# Patient Record
Sex: Female | Born: 1975
Health system: Southern US, Community
[De-identification: ages and names within clinical notes are randomized; demographics above are authoritative.]

## PROBLEM LIST (undated history)

## (undated) DIAGNOSIS — E079 Disorder of thyroid, unspecified: Secondary | ICD-10-CM

## (undated) DIAGNOSIS — A048 Other specified bacterial intestinal infections: Secondary | ICD-10-CM

## (undated) HISTORY — PX: TUBAL LIGATION: SHX77

## (undated) HISTORY — PX: APPENDECTOMY: SHX54

## (undated) HISTORY — DX: Other specified bacterial intestinal infections: A04.8

## (undated) HISTORY — DX: Disorder of thyroid, unspecified: E07.9

---

## 1998-10-11 ENCOUNTER — Inpatient Hospital Stay (HOSPITAL_COMMUNITY): Admission: AD | Admit: 1998-10-11 | Discharge: 1998-10-11 | Payer: Self-pay | Admitting: Obstetrics and Gynecology

## 1999-04-16 ENCOUNTER — Inpatient Hospital Stay (HOSPITAL_COMMUNITY): Admission: AD | Admit: 1999-04-16 | Discharge: 1999-04-16 | Payer: Self-pay | Admitting: Obstetrics and Gynecology

## 1999-05-14 ENCOUNTER — Inpatient Hospital Stay (HOSPITAL_COMMUNITY): Admission: AD | Admit: 1999-05-14 | Discharge: 1999-05-17 | Payer: Self-pay | Admitting: Obstetrics and Gynecology

## 2000-01-08 ENCOUNTER — Other Ambulatory Visit: Admission: RE | Admit: 2000-01-08 | Discharge: 2000-01-08 | Payer: Self-pay | Admitting: Obstetrics and Gynecology

## 2000-08-17 ENCOUNTER — Inpatient Hospital Stay (HOSPITAL_COMMUNITY): Admission: AD | Admit: 2000-08-17 | Discharge: 2000-08-17 | Payer: Self-pay | Admitting: *Deleted

## 2001-03-09 ENCOUNTER — Encounter: Payer: Self-pay | Admitting: Obstetrics and Gynecology

## 2001-03-09 ENCOUNTER — Ambulatory Visit (HOSPITAL_COMMUNITY): Admission: RE | Admit: 2001-03-09 | Discharge: 2001-03-09 | Payer: Self-pay | Admitting: Obstetrics and Gynecology

## 2001-04-09 ENCOUNTER — Other Ambulatory Visit: Admission: RE | Admit: 2001-04-09 | Discharge: 2001-04-09 | Payer: Self-pay | Admitting: Obstetrics and Gynecology

## 2001-05-24 ENCOUNTER — Inpatient Hospital Stay (HOSPITAL_COMMUNITY): Admission: AD | Admit: 2001-05-24 | Discharge: 2001-05-24 | Payer: Self-pay | Admitting: Obstetrics and Gynecology

## 2001-08-04 ENCOUNTER — Encounter: Payer: Self-pay | Admitting: Gastroenterology

## 2001-08-04 ENCOUNTER — Ambulatory Visit (HOSPITAL_COMMUNITY): Admission: RE | Admit: 2001-08-04 | Discharge: 2001-08-04 | Payer: Self-pay | Admitting: Gastroenterology

## 2001-09-10 ENCOUNTER — Encounter: Payer: Self-pay | Admitting: Obstetrics and Gynecology

## 2001-09-10 ENCOUNTER — Ambulatory Visit (HOSPITAL_COMMUNITY): Admission: RE | Admit: 2001-09-10 | Discharge: 2001-09-10 | Payer: Self-pay | Admitting: Obstetrics and Gynecology

## 2001-10-07 ENCOUNTER — Encounter: Payer: Self-pay | Admitting: *Deleted

## 2001-10-07 ENCOUNTER — Ambulatory Visit (HOSPITAL_COMMUNITY): Admission: RE | Admit: 2001-10-07 | Discharge: 2001-10-07 | Payer: Self-pay | Admitting: *Deleted

## 2001-10-12 ENCOUNTER — Encounter: Payer: Self-pay | Admitting: Obstetrics and Gynecology

## 2001-10-12 ENCOUNTER — Encounter (INDEPENDENT_AMBULATORY_CARE_PROVIDER_SITE_OTHER): Payer: Self-pay | Admitting: Specialist

## 2001-10-12 ENCOUNTER — Inpatient Hospital Stay (HOSPITAL_COMMUNITY): Admission: AD | Admit: 2001-10-12 | Discharge: 2001-10-15 | Payer: Self-pay | Admitting: Obstetrics and Gynecology

## 2001-11-21 ENCOUNTER — Other Ambulatory Visit: Admission: RE | Admit: 2001-11-21 | Discharge: 2001-11-21 | Payer: Self-pay | Admitting: Obstetrics and Gynecology

## 2006-06-02 ENCOUNTER — Ambulatory Visit (HOSPITAL_COMMUNITY): Admission: RE | Admit: 2006-06-02 | Discharge: 2006-06-02 | Payer: Self-pay | Admitting: Obstetrics and Gynecology

## 2006-06-08 ENCOUNTER — Encounter (INDEPENDENT_AMBULATORY_CARE_PROVIDER_SITE_OTHER): Payer: Self-pay | Admitting: *Deleted

## 2006-06-08 ENCOUNTER — Ambulatory Visit (HOSPITAL_COMMUNITY): Admission: RE | Admit: 2006-06-08 | Discharge: 2006-06-08 | Payer: Self-pay | Admitting: Obstetrics and Gynecology

## 2009-12-15 ENCOUNTER — Ambulatory Visit (HOSPITAL_COMMUNITY): Admission: RE | Admit: 2009-12-15 | Discharge: 2009-12-15 | Payer: Self-pay | Admitting: Obstetrics & Gynecology

## 2010-02-08 ENCOUNTER — Ambulatory Visit: Payer: Self-pay | Admitting: Gastroenterology

## 2010-02-08 DIAGNOSIS — R1013 Epigastric pain: Secondary | ICD-10-CM | POA: Insufficient documentation

## 2010-02-08 DIAGNOSIS — R112 Nausea with vomiting, unspecified: Secondary | ICD-10-CM | POA: Insufficient documentation

## 2010-02-08 DIAGNOSIS — R1084 Generalized abdominal pain: Secondary | ICD-10-CM | POA: Insufficient documentation

## 2010-02-08 DIAGNOSIS — R197 Diarrhea, unspecified: Secondary | ICD-10-CM | POA: Insufficient documentation

## 2010-02-09 ENCOUNTER — Encounter: Payer: Self-pay | Admitting: Gastroenterology

## 2010-02-10 ENCOUNTER — Encounter: Payer: Self-pay | Admitting: Gastroenterology

## 2010-02-15 ENCOUNTER — Ambulatory Visit: Payer: Self-pay | Admitting: Gastroenterology

## 2010-02-15 ENCOUNTER — Ambulatory Visit (HOSPITAL_COMMUNITY): Admission: RE | Admit: 2010-02-15 | Discharge: 2010-02-15 | Payer: Self-pay | Admitting: Gastroenterology

## 2010-05-05 ENCOUNTER — Encounter (INDEPENDENT_AMBULATORY_CARE_PROVIDER_SITE_OTHER): Payer: Self-pay | Admitting: *Deleted

## 2010-08-10 NOTE — Letter (Signed)
Summary: Recall Office Visit  [facility]  [address]   [place], [place] [address]   Phone: [phone]  Fax: [phone]      May 05, 2010   [facility] [address] [place], [place]  [address] 03-22-1976   Dear Ms. HAMBY,   According to our records, it is time for you to schedule a follow-up office visit with [place].   At your convenience, please call [phone] to schedule an office visit. If you have any questions, concerns, or feel that this letter is in error, we would appreciate your call.   Sincerely,    Diana Eves  [facility] Ph: [phone]   Fax: [phone]

## 2010-08-10 NOTE — Letter (Signed)
Summary: Internal Other /EGD order  Internal Other /EGD order   Imported By: Cloria Spring LPN 04/54/0981 19:14:78  _____________________________________________________________________  External Attachment:    Type:   Image     Comment:   External Document

## 2010-08-10 NOTE — Assessment & Plan Note (Signed)
Summary: NAUSEA, VOMITING, DIARRHEA, ABD PAIN   Visit Type:  Initial Consult Referring Provider:  Cyril Mourning Primary Care Provider:  Cyril Mourning, NP-c  Chief Complaint:  abd pain/acid reflux/n/v.  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 2008: Saw Dr. Carla Drape, wgt: 173 lbs, EGD-SML HIATAL HERNIA, NO CELIAC SPRUE. 2009: NL ABD U/S. APR 2010: 171 LBS. 2009: 171 LBS, ?IBS-D, ON PROZAC 40 MG once daily, RX: COLESTID, DICYCLOMINE, ESOPHAGEAL MANOMETRY-? hypomotile EMD.  Last Fall started having NVD 12-36 hours. Episodes:q3-4 weeks. Bad one in May/June. Called [place]. ABD u/S normal. ?PUD-Hpylori neg. Contstant heartburn. If stomach empties it hurts, spicy, if eats it hurts. No triggers. 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 Ibuprofen, Motrin, ALeve-2-3 times a week, Excedrin Migraine.  EtOH: once a week. No cigs. 8-10 cups of coffee/day.  Aches all the time and then when more intense sharp and stabbing, mid-epigastric and s/t LUQ. No blood in stool or black tarry stools. Milk: 2x/week, ice cream: 0, cheese: 2-3x/wk. No travel, well water, or Abx. 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   Current Medications (verified): 1)  Zantac 300 Mg Tabs (Ranitidine Hcl) .... Take 1 Tablet By Mouth Once A Day 2)  Synthroid 150 Mcg Tabs (Levothyroxine Sodium) .... Take 1 Tablet By Mouth Once A Day 3)  Multivitamins  Caps (Multiple Vitamin) .... Take 1 Tablet By Mouth Once A Day 4)  Omeprazole 40 Mg Cpdr (Omeprazole) .... Take 1 Tablet By Mouth Once A Day  Allergies (verified): 1)  ! * Clindamycin 2)  ! * Latex  Past History:  Past Medical History: "GERD" **MAY 2010-48 HR BRAVO-[facility], oN Zegerid/Zantac-day 1: 10 episodes of reflux, Demeester: 0.9, SAP-95.% heartburn; day 2: 3 episodes of reflux, demeester: 0.3, SAP: 95.9% HEARTBURN Hypothyroidism 2008: BROTHER WITH ADENOMATOUS POLYP?, TCS: prep good, Internal hemorrhoids  Past Surgical  History: Appendectomy Tubal Ligation  Family History: FH of Colon Cancer: mat aunt Brother: polyps: 30s No IBS, liver disease, PUD, or diarrheal illnesses  Social History: Occupation: Charity fundraiser 3 kids: 15, 10, 8  Review of Systems       Per HPI otherwise all systems negative.   Vital Signs:  Patient profile:   35 year old female Height:      65 inches Weight:      202 pounds BMI:     33.74 Temp:     98.1 degrees F oral Pulse rate:   88 / minute BP sitting:   102 / 80  (left arm) Cuff size:   large  Vitals Entered By: Cloria Spring LPN (February 08, 2010 10:06 AM)  Physical Exam  General:  Well developed, well nourished, no acute distress. Head:  Normocephalic and atraumatic. Eyes:  PERRLA, no icterus. Mouth:  No deformity or lesions. Neck:  Supple; no masses. Lungs:  Clear throughout to auscultation. Heart:  Regular rate and rhythm; no murmurs. Abdomen:  Soft, nontender and nondistended. No masses, or hernias noted. Normal bowel sounds. Extremities:  No edema or deformities noted. Neurologic:  Alert and  oriented x4;  grossly normal neurologically.  Impression & Recommendations:  Problem # 1:  NAUSEA WITH VOMITING (ICD-787.01) SYMPTOMS MAY BE DUE TO: **Uncontrolled GERD, NSAID or H. pylori gastritis, Celiac sprue, IBS. Take Dexilant every morning or OMP 30 minues before meals. Avoid Excedrin, Ibuprofen, and Motrin.  Wean down to 200 mg of coffee daily over the next 2-3 weeks.  Upper endoscopy next week. REPEAT BIOPSIES FOR CELIAC SPRUE.  Submit stool for CDIFF toxin, Giardia, and fecal white cells. Return in 3 months.  Orders: T-Fecal WBC ([id number]) T-Stool Giardia / Crypto- EIA ([id number]) T-Culture, C-Diff Toxin A/B ([id number]) T-Culture, C-Diff Toxin A/B ([id number]) T-Culture, C-Diff Toxin A/B ([id number])  Problem # 2:  EPIGASTRIC PAIN (ICD-789.06) FOLLOW A LOW FAT DIET. SYMPTOMS MAY BE DUE TO: **Uncontrolled GERD, NSAID or H. pylori gastritis, Celiac sprue,  IBS, non-ulcer dyspepsia. Take Dexilant every morning or OMP 30 minues before meals. Avoid Excedrin, Ibuprofen, and Motrin.  Wean down to 200 mg of coffee daily over the next 2-3 weeks. Lose 10-20 lbs over the NEXT 6 MOS. MAY NEED AN SSRI.  Problem # 3:  DIARRHEA (ICD-787.91) SYMPTOMS MAY BE DUE TO: Celiac sprue, IBS-D.  Upper endoscopy next week. REPEAT BIOPSIES FOR CELIAC SPRUE. Submit stool for CDIFF toxin, Giardia, and fecal white cells.  Orders: T-Fecal WBC ([id number]) T-Stool Giardia / Crypto- EIA ([id number]) T-Culture, C-Diff Toxin A/B ([id number]) T-Culture, C-Diff Toxin A/B ([id number]) T-Culture, C-Diff Toxin A/B ([id number])  Problem # 4:  SCREENING, COLON CANCER (ICD-V76.51) Assessment: Comment Only Brother: ? simple adenoma age 105s, TCS AT AGE 37.  CC: PCP  Patient Instructions: 1)  YOUR SYMPTOMS MAY BE DUE TO: 2)  **Uncontrolled GERD, NSAID or H. pylori gastritis, Celiac sprue, IBS 3)  Take Dexilant every morning or OMP 30 minues before meals. 4)  STOP TAKING RANITIDINE. May use every other month. 5)  Avoid Excedrin, Ibuprofen, and Motrin.  6)  Wean down to 200 mg of coffee daily over the next 2-3 weeks.  7)  FOLLOW A LOW FAT DIET. 8)  Lose 10-20 lbs over the NEXT 6 MOS. 9)  Upper endoscopy next week. 10)  Submit stool for CDIFF toxin, Giardia, and fecal white cells. 11)  Return in 3 months. 12)  The medication list was reviewed and reconciled.  All changed / newly prescribed medications were explained.  A complete medication list was provided to the patient / caregiver. Prescriptions: OMEPRAZOLE 40 MG CPDR (OMEPRAZOLE) Take 1 tablet by mouth 30 minutes prior to first  meal  #30 x 5   Entered and Authorized by:   West Bali MD   Signed by:   West Bali MD on 02/08/2010   Method used:   Print then Give to Patient   RxID:   [id number]   Appended Document: NAUSEA, VOMITING, DIARRHEA, ABD PAIN JUNE 2011 HB 13.1 PLT 262 CR 0.77 AST 13 ALT 13 ALB 4.3 H.  PYLORI IgG 0.5  Appended Document: NAUSEA, VOMITING, DIARRHEA, ABD PAIN 3 MONTH REMINDER APPOINTMENT IS IN IDX/LAW  Appended Document: Orders Update    Clinical Lists Changes  Problems: Added new problem of ABDOMINAL PAIN, GENERALIZED (ICD-789.07) Orders: Added new Service order of Consultation Level IV [phone]) - Signed

## 2010-09-25 ENCOUNTER — Encounter: Payer: Self-pay | Admitting: Internal Medicine

## 2010-10-05 NOTE — Letter (Signed)
Summary: lab indigent form  lab indigent form   Imported By: Hendricks Limes LPN 46/96/2952 84:13:24  _____________________________________________________________________  External Attachment:    Type:   Image     Comment:   External Document

## 2010-11-24 NOTE — Op Note (Signed)
[facility]  PatientMAEBELL, Shannon [name] Visit Number: [id number] MRN: [id number]          Service Type: OBS Location: 910B 9163 01 Attending Physician:  Maxie Better Dictated by:   Genia Del, M.D. Proc. Date: 10/13/01 Admit Date:  10/12/2001                             Operative Report  DATE OF BIRTH:                Feb 02, 1976  PREOPERATIVE DIAGNOSES:       Desire for bilateral tubal ligation postpartum.  POSTOPERATIVE DIAGNOSES:      Desire for bilateral tubal ligation postpartum.  INTERVENTION:                 Postpartum bilateral tubal ligation by modified Pomeroy technique by mini laparotomy.  SURGEON:                      Genia Del, M.D.  ANESTHESIOLOGIST:             Dr. Harvest Forest  PROCEDURE:                    Under epidural anesthesia the patient is in dorsal decubitus position.  She is prepped with Hibiclens on the abdominal and suprapubic area and draped as usual.  An infraumbilical incision is made with the scalpel over 2 cm.  We then open the aponeurosis transversely with the Mayo scissors and the parietoperitoneum transversely as well with Metzenbaum scissors.  The intra-abdominal cavity is entered.  We use Navy retractors and we bring our incision towards the right tube.  With a wet 4 x 4 the bowels are retracted away from our field and we reach the right tube.  Babcocks are used to grab the right tube and expose it.  Fimbriae are visualized confirming that the structure corresponds to the tube.  We create a window in the mesosalpinx with electrocautery.  We use 0 plain to suture the tube proximally and distally at about 2 cm from the cornua.  We then use Metzenbaum scissors to cut a section of the tube between the two sutures and send it to pathology. We cauterize the cut ends of the tube and control hemostasis.  We proceed exactly the same way on the left tube.  We then close the aponeurosis with Vicryl  0 in a running suture.  We close the skin with a subcuticular suture of Monocryl 4-0.  Hemostasis is adequate.  On the skin a very superficial minor burn is present caused by the electrocautery.  It is washed and a Band-aid is applied on it.  Infiltration of the subcutaneous tissue was done with Marcaine 0.25% 10 cc at the beginning of the intervention.  Estimated blood loss was minimal.  No complications occurred.  The patient was brought to recovery room in good status. Dictated by:   Genia Del, M.D. Attending Physician:  Maxie Better DD:  10/13/01 TD:  10/14/01 Job: [id number]

## 2010-11-24 NOTE — Op Note (Signed)
NAME:  Shannon [name], Shannon [name]               ACCOUNT NO.:  [id number]   MEDICAL RECORD NO.:  [id number]          PATIENT TYPE:  AMB   LOCATION:  SDC                           FACILITY:  [facility]   PHYSICIAN:  Maxie Better, M.D.DATE OF BIRTH:  Nov 08, 1975   DATE OF PROCEDURE:  06/08/2006  DATE OF DISCHARGE:                               OPERATIVE REPORT   PREOPERATIVE DIAGNOSIS:  Missed abortion, recurrent pregnancy loss.   PROCEDURE:  Suction dilation evacuation with chromosomal analysis.   POSTOPERATIVE DIAGNOSIS:  Missed abortion, recurrent pregnancy loss.   ANESTHESIA:  MAC paracervical block.   SURGEON:  Maxie Better, M.D.   DESCRIPTION OF PROCEDURE:  Under adequate monitored anesthesia the  patient is placed in the dorsal lithotomy position.  She was sterilely  prepped and draped in the usual fashion.  The bladder was catheterized  for scant amount of urine.  Examination under anesthesia revealed a 10  weeks' anteverted uterus.  No adnexal masses could be appreciated.  A  bivalve speculum was placed in the vagina.  A single-tooth tenaculum was  placed on the anterior lip of the cervix.  Then 21 mL of 1% Nesacaine  was injected paracervically at 3 and 9 o'clock.  The cervix was parous.  The cervix was easily dilated up to the #29 [facility] dilator.  A #8 mm  curved suction cannula was introduced into the uterine cavity.  Moderate  amount of products of conception was obtained.  The cavity was  suctioned, curetted, and suctioned until all tissue was felt to have  been removed; at which time all instruments were then removed from the  vagina.  Specimen labeled products of conception; and a piece of it was  sent for chromosomal analysis; and the rest of the products of  conception were sent to pathology.  Estimated blood loss was minimal.  Maternal blood type is A+.  Complications were none.   The patient tolerated procedure well; and was transferred to the  recovery room in stable  condition.      Maxie Better, M.D.  Electronically Signed     [place]/MEDQ  D:  06/08/2006  T:  06/08/2006  Job:  [id number]

## 2011-05-04 ENCOUNTER — Encounter: Payer: Self-pay | Admitting: Family Medicine

## 2011-05-04 ENCOUNTER — Ambulatory Visit (INDEPENDENT_AMBULATORY_CARE_PROVIDER_SITE_OTHER): Payer: Self-pay | Admitting: Family Medicine

## 2011-05-04 VITALS — BP 104/80 | HR 73 | Temp 98.5°F | Ht 65.0 in | Wt 200.2 lb

## 2011-05-04 DIAGNOSIS — A048 Other specified bacterial intestinal infections: Secondary | ICD-10-CM | POA: Insufficient documentation

## 2011-05-04 DIAGNOSIS — E079 Disorder of thyroid, unspecified: Secondary | ICD-10-CM | POA: Insufficient documentation

## 2011-05-04 MED ORDER — LEVOTHYROXINE SODIUM 150 MCG PO TABS: 150.0000 ug | ORAL_TABLET | Freq: Every day | ORAL | Status: DC

## 2011-05-04 MED ORDER — AMOXICILLIN 500 MG PO CAPS: 1000.0000 mg | ORAL_CAPSULE | Freq: Two times a day (BID) | ORAL | Status: AC

## 2011-05-04 MED ORDER — FLUCONAZOLE 150 MG PO TABS: 150.0000 mg | ORAL_TABLET | Freq: Once | ORAL | Status: AC

## 2011-05-04 MED ORDER — CLARITHROMYCIN 500 MG PO TABS: 500.0000 mg | ORAL_TABLET | Freq: Two times a day (BID) | ORAL | Status: AC

## 2011-05-04 NOTE — Patient Instructions (Signed)
So nice to meet you. Please let me know how you're feeling a couple of weeks.

## 2011-05-04 NOTE — Progress Notes (Signed)
Subjective:    Patient ID: Shannon [name], female    DOB: 26-Sep-1975, 35 y.o.   MRN: [id number]  HPI  35 yo here to establish care. She is self pay and would like to only discuss acute issues today.  Hypothyroidism- has been on thyroid replacement since 2000. Currently on Levothyroxine 150 mcg daily. Denies any symptoms of hypo or hyperthyroidism. Brings in labs from [place] from 05/02/2011- TSH 2.449, FT4 1.26. Needs refills on her medication.  H.pylori- per pt, was treated years ago with triple therapy. Has a h/o hiatal hernia. Taking Zantac 300 mg daily and Nexium 40 mg daily and symptoms of reflux are returning. No abdominal pain, nausea, vomiting or diarrhea. Patient Active Problem List  Diagnoses  . NAUSEA WITH VOMITING  . DIARRHEA  . EPIGASTRIC PAIN  . ABDOMINAL PAIN, GENERALIZED  . Thyroid disease  . H. pylori infection   Past Medical History  Diagnosis Date  . Thyroid disease   . H. pylori infection    Past Surgical History  Procedure Date  . Tubal ligation   . Appendectomy    History  Substance Use Topics  . Smoking status: Never Smoker   . Smokeless tobacco: Not on file  . Alcohol Use: Not on file   Family History  Problem Relation Age of Onset  . Diabetes Mother     Type I   Allergies  Allergen Reactions  . Clindamycin   . Latex    Current outpatient prescriptions:Aspirin-Salicylamide-Caffeine (BC HEADACHE POWDER PO), Use as directed as needed , Disp: , Rfl: ;  calcium carbonate (TUMS - DOSED IN MG ELEMENTAL CALCIUM) 500 MG chewable tablet, Chew 1 tablet by mouth as needed.  , Disp: , Rfl: ;  ibuprofen (ADVIL,MOTRIN) 200 MG tablet, As needed , Disp: , Rfl:  levothyroxine (SYNTHROID, LEVOTHROID) 150 MCG tablet, Take 1 tablet (150 mcg total) by mouth daily., Disp: 30 tablet, Rfl: 6;  loratadine (CLARITIN) 10 MG tablet, Take 10 mg by mouth every morning.  , Disp: , Rfl: ;  omeprazole (PRILOSEC) 40 MG capsule, Take 40 mg by mouth every morning.  , Disp:  , Rfl: ;  ranitidine (ZANTAC) 300 MG tablet, Take 300 mg by mouth daily.  , Disp: , Rfl:  amoxicillin (AMOXIL) 500 MG capsule, Take 2 capsules (1,000 mg total) by mouth 2 (two) times daily., Disp: 56 capsule, Rfl: 0;  clarithromycin (BIAXIN) 500 MG tablet, Take 1 tablet (500 mg total) by mouth 2 (two) times daily., Disp: 28 tablet, Rfl: 0;  fluconazole (DIFLUCAN) 150 MG tablet, Take 1 tablet (150 mg total) by mouth once., Disp: 1 tablet, Rfl: 0  The PMH, PSH, Social History, Family History, Medications, and allergies have been reviewed in [facility], and have been updated if relevant.   Review of Systems See HPI Patient reports no  vision/ hearing changes,anorexia, weight change, fever ,adenopathy, persistant / recurrent hoarseness, swallowing issues, chest pain, edema,persistant / recurrent cough, hemoptysis, dyspnea(rest, exertional, paroxysmal nocturnal), gastrointestinal  bleeding (melena, rectal bleeding), abdominal pain, \ GU symptoms(dysuria, hematuria, pyuria, voiding/incontinence  Issues) syncope, focal weakness, severe memory loss, concerning skin lesions, depression, anxiety, abnormal bruising/bleeding, [name] joint swelling, breast masses or abnormal vaginal bleeding.       Objective:   Physical Exam BP 104/80  Pulse 73  Temp(Src) 98.5 F (36.9 C) (Oral)  Ht 5\' 5"  (1.651 m)  Wt 200 lb 4 oz (90.833 kg)  BMI 33.32 kg/m2  LMP 04/19/2011  General:  Well-developed,well-nourished,in no acute distress; alert,appropriate and cooperative  throughout examination Head:  normocephalic and atraumatic.   Eyes:  vision grossly intact, pupils equal, pupils round, and pupils reactive to light.   Ears:  R ear normal and L ear normal.   Nose:  no external deformity.   Mouth:  good dentition.   Neck:  No deformities, masses, or tenderness noted.  Lungs:  Normal respiratory effort, chest expands symmetrically. Lungs are clear to auscultation, no crackles or wheezes. Heart:  Normal rate and regular  rhythm. S1 and S2 normal without gallop, murmur, click, rub or other extra sounds. Abdomen:  Bowel sounds positive,abdomen soft and non-tender without masses, organomegaly or hernias noted. Msk:  No deformity or scoliosis noted of thoracic or lumbar spine.   Extremities:  No clubbing, cyanosis, edema, or deformity noted with normal full range of motion of all joints.   Neurologic:  alert & oriented X3 and gait normal.   Skin:  Intact without suspicious lesions or rashes Psych:  Cognition and judgment appear intact. Alert and cooperative with normal attention span and concentration. No apparent delusions, illusions, hallucinations    Assessment & Plan:   1. H. pylori infection  Deteriorated. Awaiting records.  Discussed typical course of H.Pylori with pt and that if symptoms return, we cannot assume reinfection. Since she does not have insurance and just had GI work up last year, I will repeat amoxicillin and clarithromycin with her PPI but if symptoms persist, must see GI. The patient indicates understanding of these issues and agrees with the plan.    2. Thyroid disease  Stable.  Refilled her synthroid.

## 2011-09-06 ENCOUNTER — Ambulatory Visit: Payer: Self-pay | Admitting: Obstetrics and Gynecology

## 2011-09-06 DIAGNOSIS — I499 Cardiac arrhythmia, unspecified: Secondary | ICD-10-CM

## 2011-09-06 LAB — URINALYSIS, COMPLETE
Bacteria: NONE SEEN
Bilirubin,UR: NEGATIVE
Blood: NEGATIVE
Ketone: NEGATIVE
Leukocyte Esterase: NEGATIVE
Nitrite: NEGATIVE
Ph: 6 (ref 4.5–8.0)
Protein: NEGATIVE
RBC,UR: NONE SEEN /HPF (ref 0–5)
Squamous Epithelial: 2

## 2011-09-07 HISTORY — PX: ENDOMETRIAL ABLATION W/ NOVASURE: SUR434

## 2011-09-14 ENCOUNTER — Ambulatory Visit: Payer: Self-pay | Admitting: Obstetrics and Gynecology

## 2011-09-14 LAB — PREGNANCY, URINE: Pregnancy Test, Urine: NEGATIVE m[IU]/mL

## 2012-01-26 ENCOUNTER — Other Ambulatory Visit: Payer: Self-pay | Admitting: Family Medicine

## 2012-03-15 ENCOUNTER — Emergency Department: Payer: Self-pay | Admitting: Emergency Medicine

## 2012-03-17 ENCOUNTER — Telehealth: Payer: Self-pay | Admitting: Family Medicine

## 2012-03-17 NOTE — Telephone Encounter (Signed)
Triage Record Num: [id number] Operator: Dustin Flock [place] Patient Name: Shannon [name] Call Date & Time: 03/14/2012 8:14:05PM Patient Phone: [phone] PCP: Ruthe Mannan Patient Gender: Female PCP Fax : [phone] Patient DOB: 1975-10-09 Practice Name: [facility] Reason for Call: Caller: [place]/Patient; PCP: Ruthe Mannan (Family Practice); CB#: [phone]; Call regarding Pain in left calf, swelling;" Earlier in the week" thought she had a pulled muscle, Has been walking. Has been elevating leg. Has been getting wrose. No redness or streaks. Is very tight and tingling. Foot is slightly swollen. Is swollen in calf below the knee. Caller denied any known injury. Leg/foot is tingling. Foot is pink. It is painful to walk, 1-10=2. States foot is tingling now but is not as bad in the mornings. Emergent S&S identified per Numbness/Tingling Adult Protocol, " New numbess/tingling associated with weaksess or paralysis involving face arm or leg, especially on same side of body, or loos of coordination occuring now or within last 2 hrs". Advised 911/ ED. Caller denied stating she will go to [facility] tomorrow morning. I asked if she could not go tonight? Caller denied stating she has her children with her and she did not want to take them to ED. Protocol(s) Used: Leg Non-Injury Protocol(s) Used: Numbness or Tingling Recommended Outcome per Protocol: Activate EMS 911 Reason for Outcome: Gradual onset or worsening numbness/tingling New numbness/tingling associated with weakness or paralysis (unable to move) involving face, arm or leg, especially on same side of body, or loss of coordination (purposeful action) occurring now or within last 2 hours. Numbness in the groin and saddle area of pelvis AND lower extremity weakness OR change in bowel or bladder control (loss of control, retention, overflow) Care Advice: ~ Protect the patient from falling or other harm. ~ An adult  should stay with the patient, preferably one trained in CPR. Write down provider's name. List or place the following in a bag for transport with the patient: current prescription and/or nonprescription medications; alternative treatments, therapies and medications; and street drugs. ~ 03/14/2012 8:33:11PM Page 1 of 1 [email or web address]

## 2012-03-17 NOTE — Telephone Encounter (Signed)
Left message asking that patient call back 

## 2012-03-17 NOTE — Telephone Encounter (Signed)
Please call to check on pt. 

## 2012-03-18 NOTE — Telephone Encounter (Signed)
Left another message asking that pt call back.

## 2012-06-10 ENCOUNTER — Telehealth: Payer: Self-pay | Admitting: Family Medicine

## 2012-06-10 NOTE — Telephone Encounter (Signed)
Patient needs a refill on her thyroid medicine...she has made an appt. With Dr. Dayton Martes on this Thursday 12/4. She knows she will have to have bloodwork and it may change but she needs medicine to get her through and she cannot come until Thursday.  She works out of town.

## 2012-06-11 NOTE — Telephone Encounter (Signed)
Pt is coming in for office visit tomorrow.

## 2012-06-11 NOTE — Telephone Encounter (Signed)
Left message asking that patient call back 

## 2012-06-12 ENCOUNTER — Ambulatory Visit (INDEPENDENT_AMBULATORY_CARE_PROVIDER_SITE_OTHER): Payer: 59 | Admitting: Family Medicine

## 2012-06-12 ENCOUNTER — Encounter: Payer: Self-pay | Admitting: Family Medicine

## 2012-06-12 VITALS — BP 120/74 | HR 68 | Temp 98.0°F | Wt 200.0 lb

## 2012-06-12 DIAGNOSIS — E079 Disorder of thyroid, unspecified: Secondary | ICD-10-CM

## 2012-06-12 LAB — TSH: TSH: 1.31 u[IU]/mL (ref 0.35–5.50)

## 2012-06-12 LAB — T4, FREE: Free T4: 0.89 ng/dL (ref 0.60–1.60)

## 2012-06-12 MED ORDER — LEVOTHYROXINE SODIUM 150 MCG PO TABS: ORAL_TABLET | ORAL | Status: DC

## 2012-06-12 NOTE — Progress Notes (Signed)
Subjective:    Patient ID: Shannon [name], female    DOB: 1975/10/08, 36 y.o.   MRN: [id number]  HPI  36 yo here to follow up on hypothyroidism.  Has a new job as a Immunologist in [employer]  Hypothyroidism- has been on thyroid replacement since 2000. Currently on Levothyroxine 150 mcg daily. Denies any symptoms of hypo or hyperthyroidism.   Needs refills on her medication. Lab Results  Component Value Date   TSH 2.24 05/02/2011      Patient Active Problem List  Diagnosis  . NAUSEA WITH VOMITING  . DIARRHEA  . EPIGASTRIC PAIN  . ABDOMINAL PAIN, GENERALIZED  . Thyroid disease  . H. pylori infection   Past Medical History  Diagnosis Date  . Thyroid disease   . H. pylori infection    Past Surgical History  Procedure Date  . Tubal ligation   . Appendectomy    History  Substance Use Topics  . Smoking status: Never Smoker   . Smokeless tobacco: Not on file  . Alcohol Use: Not on file   Family History  Problem Relation Age of Onset  . Diabetes Mother     Type I   Allergies  Allergen Reactions  . Clindamycin   . Latex    Current outpatient prescriptions:Aspirin-Salicylamide-Caffeine (BC HEADACHE POWDER PO), Use as directed as needed , Disp: , Rfl: ;  calcium carbonate (TUMS - DOSED IN MG ELEMENTAL CALCIUM) 500 MG chewable tablet, Chew 1 tablet by mouth as needed.  , Disp: , Rfl: ;  ibuprofen (ADVIL,MOTRIN) 200 MG tablet, As needed , Disp: , Rfl: ;  levothyroxine (SYNTHROID, LEVOTHROID) 150 MCG tablet, take 1 tablet by mouth once daily, Disp: 30 tablet, Rfl: 3 loratadine (CLARITIN) 10 MG tablet, Take 10 mg by mouth every morning.  , Disp: , Rfl: ;  omeprazole (PRILOSEC) 40 MG capsule, Take 40 mg by mouth every morning.  , Disp: , Rfl: ;  ranitidine (ZANTAC) 300 MG tablet, Take 300 mg by mouth daily.  , Disp: , Rfl:   The PMH, PSH, Social History, Family History, Medications, and allergies have been reviewed in [facility], and have been updated if relevant.   Review of  Systems See HPI     Objective:   Physical Exam BP 120/74  Pulse 68  Temp 98 F (36.7 C)  Wt 200 lb (90.719 kg) Wt Readings from Last 3 Encounters:  06/12/12 200 lb (90.719 kg)  05/04/11 200 lb 4 oz (90.833 kg)  02/08/10 202 lb (91.627 kg)    General:  Well-developed,well-nourished,in no acute distress; alert,appropriate and cooperative throughout examination Head:  normocephalic and atraumatic.   Nose:  no external deformity.   Mouth:  good dentition.   Neck:  No deformities, masses, or tenderness noted.  Lungs:  Normal respiratory effort, chest expands symmetrically. Lungs are clear to auscultation, no crackles or wheezes. Heart:  Normal rate and regular rhythm. S1 and S2 normal without gallop, murmur, click, rub or other extra sounds. Msk:  No deformity or scoliosis noted of thoracic or lumbar spine.   Extremities:  No clubbing, cyanosis, edema, or deformity noted with normal full range of motion of all joints.   Neurologic:  alert & oriented X3 and gait normal.   Skin:  Intact without suspicious lesions or rashes Psych:  Cognition and judgment appear intact. Alert and cooperative with normal attention span and concentration. No apparent delusions, illusions, hallucinations    Assessment & Plan:   1. Thyroid disease  Stable.  Refilled her synthroid. Orders Placed This Encounter  Procedures  . TSH  . T4, Free

## 2012-06-12 NOTE — Patient Instructions (Addendum)
Congratulations on your new job!  We will call you with your lab results.

## 2012-06-16 ENCOUNTER — Other Ambulatory Visit: Payer: Self-pay

## 2012-06-16 NOTE — Telephone Encounter (Signed)
Pt  Is in [place] for 2 weeks and left Levothyroxine at home; pt request called in [facility] at [phone]. I explained to pt that [facility] Thad Ranger can call [facility] in [place] and get refill transferred to [facility]. Pt voiced understanding.

## 2012-09-08 ENCOUNTER — Other Ambulatory Visit: Payer: Self-pay | Admitting: Family Medicine

## 2013-02-06 ENCOUNTER — Ambulatory Visit (INDEPENDENT_AMBULATORY_CARE_PROVIDER_SITE_OTHER): Payer: 59 | Admitting: Family Medicine

## 2013-02-06 ENCOUNTER — Encounter: Payer: Self-pay | Admitting: Family Medicine

## 2013-02-06 VITALS — BP 102/72 | HR 64 | Temp 98.0°F | Ht 65.0 in

## 2013-02-06 DIAGNOSIS — J069 Acute upper respiratory infection, unspecified: Secondary | ICD-10-CM

## 2013-02-06 MED ORDER — AMOXICILLIN 875 MG PO TABS: 875.0000 mg | ORAL_TABLET | Freq: Two times a day (BID) | ORAL | Status: DC

## 2013-02-06 MED ORDER — LEVOTHYROXINE SODIUM 150 MCG PO TABS: ORAL_TABLET | ORAL | Status: DC

## 2013-02-06 NOTE — Patient Instructions (Addendum)
Take amoxicillin as directed- 1 tablet twice daily x 10 days.  Drink lots of fluids.    Also try an antihistamine/decongestant like claritin D or zyrtec D over the counter- two times a day as needed ( have to sign for them at pharmacy).   Try over the counter nasocort-start with 2 sprays per nostril per day...and then try to taper to 1 spray per nostril once symptoms improve.   You can use warm compresses.     Call if not improving as expected in 5-7 days.

## 2013-02-06 NOTE — Progress Notes (Signed)
SUBJECTIVE:  Shannon [name] is a 37 y.o. female who complains of coryza, congestion, sneezing, sore throat, bilateral sinus pain and fever for 9 days. She denies a history of anorexia and chest pain and denies a history of asthma. Patient denies smoke cigarettes.   Patient Active Problem List   Diagnosis Date Noted  . Thyroid disease   . H. pylori infection   . DIARRHEA 02/08/2010  . EPIGASTRIC PAIN 02/08/2010  . ABDOMINAL PAIN, GENERALIZED 02/08/2010   Past Medical History  Diagnosis Date  . Thyroid disease   . H. pylori infection    Past Surgical History  Procedure Laterality Date  . Tubal ligation    . Appendectomy    . Endometrial ablation w/ novasure  09/2011   History  Substance Use Topics  . Smoking status: Never Smoker   . Smokeless tobacco: Not on file  . Alcohol Use: Not on file   Family History  Problem Relation Age of Onset  . Diabetes Mother     Type I   Allergies  Allergen Reactions  . Clindamycin   . Latex    Current Outpatient Prescriptions on File Prior to Visit  Medication Sig Dispense Refill  . Aspirin-Salicylamide-Caffeine (BC HEADACHE POWDER PO) Use as directed as needed       . calcium carbonate (TUMS - DOSED IN MG ELEMENTAL CALCIUM) 500 MG chewable tablet Chew 1 tablet by mouth as needed.        Marland Kitchen ibuprofen (ADVIL,MOTRIN) 200 MG tablet As needed       . loratadine (CLARITIN) 10 MG tablet Take 10 mg by mouth every morning.        Marland Kitchen omeprazole (PRILOSEC) 40 MG capsule Take 40 mg by mouth every morning.        . ranitidine (ZANTAC) 300 MG tablet Take 300 mg by mouth daily.         No current facility-administered medications on file prior to visit.   The PMH, PSH, Social History, Family History, Medications, and allergies have been reviewed in [facility], and have been updated if relevant.  OBJECTIVE: BP 102/72  Pulse 64  Temp(Src) 98 F (36.7 C)  Ht 5\' 5"  (1.651 m)  She appears well, vital signs are as noted. Ears normal.  Throat and pharynx  normal.  Neck supple. No adenopathy in the neck. Nose is congested. Sinuses  Tender throughout. The chest is clear, without wheezes or rales.  ASSESSMENT:  sinusitis  PLAN: Given duration and progression of symptoms, will treat for bacterial sinusitis with 10 day course of amoxicillin (see AVS). Symptomatic therapy suggested: push fluids, rest and return office visit prn if symptoms persist or worsen.  Call or return to clinic prn if these symptoms worsen or fail to improve as anticipated.

## 2013-04-20 ENCOUNTER — Ambulatory Visit (INDEPENDENT_AMBULATORY_CARE_PROVIDER_SITE_OTHER): Payer: 59 | Admitting: Family Medicine

## 2013-04-20 ENCOUNTER — Encounter: Payer: Self-pay | Admitting: Family Medicine

## 2013-04-20 VITALS — BP 100/70 | HR 70 | Temp 98.3°F | Wt 200.0 lb

## 2013-04-20 DIAGNOSIS — J020 Streptococcal pharyngitis: Secondary | ICD-10-CM

## 2013-04-20 MED ORDER — AMOXICILLIN 875 MG PO TABS: 875.0000 mg | ORAL_TABLET | Freq: Two times a day (BID) | ORAL | Status: DC

## 2013-04-20 NOTE — Assessment & Plan Note (Signed)
Presumed, didn't check RST given ST, LA, exudates, minimal cough.  She agrees.  Start amoxil, supportive tx o/w.  F/u prn.  See work note.

## 2013-04-20 NOTE — Patient Instructions (Signed)
Start the antibiotics today and get some rest.  Drink plenty of fluids.  Take care.

## 2013-04-20 NOTE — Progress Notes (Signed)
Ear pain, bilateral, started with L ear.  Taking tylenol with some relief.  Started recently.  ST recently.  No fevers.  No rhinorrhea.  Sinus pain and HA.  Not stuffy.  Minimal cough, likely clearing throat and not a true cough.  No wheeze.  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    Meds, vitals, and allergies reviewed.   ROS: See HPI.  Otherwise, noncontributory.  nad ncat Tm wnl but ETD noted Nasal exam stuffy Sinuses ttp x4 OP with erythema and B tonsillar enlargement and exudates, good clearance noted Neck with tender LA rrr ctab

## 2013-04-23 ENCOUNTER — Telehealth: Payer: Self-pay

## 2013-04-23 NOTE — Telephone Encounter (Signed)
Patient advised.

## 2013-04-23 NOTE — Telephone Encounter (Signed)
Pt left v/m was seen 04/20/13; taking medication and sorethroat is better but sinus is 10 x worse and sinus h/a and facial soreness; pt wants to know if should do supportive care or can med be prescribed to [facility]. No fever. Pt still feels "lousy".Please advise.Pt in [place] due to work until 04/25/13.Please advise. Pt request cb.

## 2013-04-23 NOTE — Telephone Encounter (Signed)
If dramatically worse, then I would prefer her to be rechecked (down there if needed).  She could also try the OTC nasal steroid (Nasacort) for the pressure. Also routed to PCP as FYI.

## 2013-07-24 ENCOUNTER — Telehealth: Payer: Self-pay | Admitting: Family Medicine

## 2013-07-24 NOTE — Telephone Encounter (Signed)
Patient Information:  Caller Name: [name]  Phone: [phone]  Patient: Shannon [name], Shannon [name]  Gender: Female  DOB: 04/09/1976  Age: 38 Years  PCP: Shannon [name], Shannon [name] [facility](Family Practice)  Pregnant: No  Office Follow Up:  Does the office need to follow up with this patient?: No  Instructions For The Office: N/[name]   Symptoms  Reason For Call & Symptoms: 07/18/13 felt bad. 07/19/13 aches, chills.  07/20/13 cough, congestion began, low grade fever around 99, taking Tylenol, Dayquil, Nyquil.   07/24/13 continues to feel bad, aches, chills, cough, congestion, low grade fever, ear congestion and sinus congestion.  Hasn't been able to use Netipot yet, but will start.   Several people pt works with have had same sxs and influenza.  Pt requests triage to see if appt needed or continue to treat sxs at home.   Care advice given for home care with callback perimeters.  Pt requests to cancel appt for 07/25/13 and will callback with any changes or worsening sxs.  Reviewed Health History In EMR: Yes  Reviewed Medications In EMR: Yes  Reviewed Allergies In EMR: Yes  Reviewed Surgeries / Procedures: Yes  Date of Onset of Symptoms: 07/18/2013  Treatments Tried: Tylenol. Dayquil, Nyquil  Treatments Tried Worked: No OB / GYN:  LMP: Unknown  Guideline(s) Used:  Influenza - Seasonal  Disposition Per Guideline:   Home Care  Reason For Disposition Reached:   Probable influenza with no complications and not HIGH RISK  Advice Given:  Reassurance  For most healthy adults, influenza feels like [name] bad cold. The dangers of influenza for normal, healthy people (under 39 years of age) are overrated.  The treatment of influenza depends on your main symptoms. Generally, treatment is the same as for other viral respiratory infections (colds). Bed rest is unnecessary.  Here is some care advice that should help.  Treating the Symptoms of Flu  Fever, Muscle Aches, and Headache: For fever more than 101 F (38.3 C), muscle aches, and  headaches, take acetaminophen every 4-6 hours (Adults 650 mg) OR ibuprofen every 6-8 hours (Adults 400-600 mg).  Sore Throat: Use throat lozenges, hard candy or warm chicken broth.  Cough: Use cough drops.  Hydrate: Drink extra liquids. If the air in your home is dry, use [name] humidifier.  No Aspirin  : Do not use aspirin for treatment of fever or pain (Reason: there is an association between influenza and Reye syndrome).  Expected Course  : The fever lasts 2-3 days, the runny nose 5-10 days, and the cough 2-3 weeks.  Call Back If:  Fever lasts more than 3 days  Runny nose lasts more than 10 days  Cough lasts more than 3 weeks  You become short of breath or worse.  For [name] Stuffy Nose - Use Nasal Washes:  Introduction: Saline (salt water) nasal irrigation (nasal wash) is an effective and simple home remedy for treating stuffy nose and sinus congestion. The nose can be irrigated by pouring, spraying, or squirting salt water into the nose and then letting it run back out.  How it Helps: The salt water rinses out excess mucus, washes out any irritants (dust, allergens) that might be present, and moistens the nasal cavity.  Methods: There are several ways to perform nasal irrigation. You can use [name] saline nasal spray bottle (available over-the-counter), [name] rubber ear syringe, [name] medical syringe without the needle, or [name] Neti Pot.  Patient Will Follow Care Advice:  YES

## 2013-07-25 ENCOUNTER — Ambulatory Visit: Payer: 59 | Admitting: Internal Medicine

## 2013-09-11 ENCOUNTER — Other Ambulatory Visit: Payer: Self-pay | Admitting: *Deleted

## 2013-09-15 ENCOUNTER — Other Ambulatory Visit: Payer: Self-pay | Admitting: *Deleted

## 2013-09-17 ENCOUNTER — Other Ambulatory Visit: Payer: Self-pay | Admitting: *Deleted

## 2013-09-17 MED ORDER — LEVOTHYROXINE SODIUM 150 MCG PO TABS: ORAL_TABLET | ORAL | Status: DC

## 2013-12-22 ENCOUNTER — Other Ambulatory Visit: Payer: Self-pay

## 2013-12-22 MED ORDER — LEVOTHYROXINE SODIUM 150 MCG PO TABS: ORAL_TABLET | ORAL | Status: DC

## 2013-12-22 NOTE — Telephone Encounter (Signed)
Pt left v/m for refill levothyrox[facility] to [facility]. Pt has appt already scheduled on 01/06/14. Advised pt done and pt will keep scheduled appt.

## 2014-01-06 ENCOUNTER — Ambulatory Visit: Payer: 59 | Admitting: Family Medicine

## 2014-01-15 ENCOUNTER — Ambulatory Visit: Payer: Self-pay | Admitting: Adult Health

## 2014-01-15 ENCOUNTER — Telehealth: Payer: Self-pay | Admitting: Family Medicine

## 2014-01-15 ENCOUNTER — Ambulatory Visit (INDEPENDENT_AMBULATORY_CARE_PROVIDER_SITE_OTHER): Payer: Federal, State, Local not specified - PPO | Admitting: Adult Health

## 2014-01-15 ENCOUNTER — Encounter: Payer: Self-pay | Admitting: Adult Health

## 2014-01-15 VITALS — BP 118/78 | HR 66 | Temp 98.1°F | Resp 14

## 2014-01-15 DIAGNOSIS — R079 Chest pain, unspecified: Secondary | ICD-10-CM

## 2014-01-15 DIAGNOSIS — R0789 Other chest pain: Secondary | ICD-10-CM

## 2014-01-15 NOTE — Progress Notes (Signed)
Pre visit review using our clinic review tool, if applicable. No additional management support is needed unless otherwise documented below in the visit note. 
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
## 2014-01-18 ENCOUNTER — Telehealth: Payer: Self-pay

## 2014-01-18 NOTE — Telephone Encounter (Signed)
Notified patient of Xray results. Patient verbalized understanding.

## 2014-01-22 ENCOUNTER — Ambulatory Visit (INDEPENDENT_AMBULATORY_CARE_PROVIDER_SITE_OTHER): Payer: Federal, State, Local not specified - PPO | Admitting: Family Medicine

## 2014-01-22 ENCOUNTER — Encounter: Payer: Self-pay | Admitting: Family Medicine

## 2014-01-22 VITALS — BP 124/78 | HR 72 | Temp 98.0°F | Wt 179.0 lb

## 2014-01-22 DIAGNOSIS — E079 Disorder of thyroid, unspecified: Secondary | ICD-10-CM

## 2014-01-22 DIAGNOSIS — N644 Mastodynia: Secondary | ICD-10-CM | POA: Insufficient documentation

## 2014-01-22 LAB — TSH: TSH: 2.22 u[IU]/mL (ref 0.35–4.50)

## 2014-01-22 LAB — T4, FREE: Free T4: 1.12 ng/dL (ref 0.60–1.60)

## 2014-01-22 LAB — T3, FREE: T3, Free: 2.6 pg/mL (ref 2.3–4.2)

## 2014-01-22 MED ORDER — LEVOTHYROXINE SODIUM 150 MCG PO TABS: ORAL_TABLET | ORAL | Status: DC
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
## 2014-01-22 NOTE — Patient Instructions (Signed)
Great to see you. I will call you with your lab results.   

## 2014-01-22 NOTE — Progress Notes (Signed)
Pre visit review using our clinic review tool, if applicable. No additional management support is needed unless otherwise documented below in the visit note. 
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
## 2014-01-22 NOTE — Assessment & Plan Note (Signed)
Recheck thyroid labs today. eRx sent for synthroid today.

## 2014-02-01 ENCOUNTER — Encounter: Payer: Federal, State, Local not specified - PPO | Admitting: Family Medicine

## 2014-02-08 ENCOUNTER — Encounter: Payer: Self-pay | Admitting: Adult Health

## 2014-02-15 ENCOUNTER — Encounter: Payer: 59 | Admitting: Family Medicine

## 2014-02-17 ENCOUNTER — Other Ambulatory Visit (HOSPITAL_COMMUNITY)
Admission: RE | Admit: 2014-02-17 | Discharge: 2014-02-17 | Disposition: A | Discharge status: Home / Self Care | Payer: Federal, State, Local not specified - PPO | Source: Ambulatory Visit | Attending: Family Medicine | Admitting: Family Medicine

## 2014-02-17 ENCOUNTER — Encounter: Payer: Self-pay | Admitting: Family Medicine

## 2014-02-17 ENCOUNTER — Ambulatory Visit (INDEPENDENT_AMBULATORY_CARE_PROVIDER_SITE_OTHER): Payer: Federal, State, Local not specified - PPO | Admitting: Family Medicine

## 2014-02-17 VITALS — BP 120/72 | HR 76 | Temp 98.2°F | Ht 65.25 in | Wt 180.2 lb

## 2014-02-17 DIAGNOSIS — Z1151 Encounter for screening for human papillomavirus (HPV): Secondary | ICD-10-CM | POA: Diagnosis present

## 2014-02-17 DIAGNOSIS — E079 Disorder of thyroid, unspecified: Secondary | ICD-10-CM

## 2014-02-17 DIAGNOSIS — N6019 Diffuse cystic mastopathy of unspecified breast: Secondary | ICD-10-CM

## 2014-02-17 DIAGNOSIS — Z01419 Encounter for gynecological examination (general) (routine) without abnormal findings: Secondary | ICD-10-CM | POA: Insufficient documentation

## 2014-02-17 DIAGNOSIS — Z136 Encounter for screening for cardiovascular disorders: Secondary | ICD-10-CM

## 2014-02-17 DIAGNOSIS — Z0000 Encounter for general adult medical examination without abnormal findings: Secondary | ICD-10-CM

## 2014-02-17 NOTE — Assessment & Plan Note (Signed)
Likely benign.  Offered mammogram but she would rather keep an eye on it and start routine screening at 39. The patient indicates understanding of these issues and agrees with the plan.

## 2014-02-17 NOTE — Assessment & Plan Note (Signed)
Stable on current dose of synthroid. No changes. 

## 2014-02-17 NOTE — Assessment & Plan Note (Signed)
Reviewed preventive care protocols, scheduled due services, and updated immunizations Discussed nutrition, exercise, diet, and healthy lifestyle. Orders Placed This Encounter  Procedures  . CBC with Differential  . Comprehensive metabolic panel  . Lipid panel    

## 2014-02-17 NOTE — Assessment & Plan Note (Signed)
Pap smear today. 
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
## 2014-02-17 NOTE — Patient Instructions (Signed)
Great to see you. Please schedule a fasting lab visit for tomorrow on your way out.

## 2014-02-17 NOTE — Progress Notes (Signed)
Pre visit review using our clinic review tool, if applicable. No additional management support is needed unless otherwise documented below in the visit note. 

## 2014-02-18 ENCOUNTER — Other Ambulatory Visit (INDEPENDENT_AMBULATORY_CARE_PROVIDER_SITE_OTHER): Payer: Federal, State, Local not specified - PPO

## 2014-02-18 ENCOUNTER — Encounter: Payer: Self-pay | Admitting: *Deleted

## 2014-02-18 DIAGNOSIS — Z136 Encounter for screening for cardiovascular disorders: Secondary | ICD-10-CM

## 2014-02-18 DIAGNOSIS — Z0000 Encounter for general adult medical examination without abnormal findings: Secondary | ICD-10-CM

## 2014-02-18 LAB — CBC WITH DIFFERENTIAL/PLATELET
BASOS PCT: 0.3 % (ref 0.0–3.0)
Basophils Absolute: 0 10*3/uL (ref 0.0–0.1)
EOS ABS: 0 10*3/uL (ref 0.0–0.7)
EOS PCT: 1 % (ref 0.0–5.0)
HEMATOCRIT: 39.1 % (ref 36.0–46.0)
HEMOGLOBIN: 13.1 g/dL (ref 12.0–15.0)
LYMPHS ABS: 1.3 10*3/uL (ref 0.7–4.0)
Lymphocytes Relative: 29 % (ref 12.0–46.0)
MCHC: 33.4 g/dL (ref 30.0–36.0)
MCV: 94.7 fl (ref 78.0–100.0)
Monocytes Absolute: 0.3 10*3/uL (ref 0.1–1.0)
Monocytes Relative: 6.4 % (ref 3.0–12.0)
NEUTROS PCT: 63.3 % (ref 43.0–77.0)
Neutro Abs: 2.8 10*3/uL (ref 1.4–7.7)
PLATELETS: 172 10*3/uL (ref 150.0–400.0)
RBC: 4.13 Mil/uL (ref 3.87–5.11)
RDW: 13.6 % (ref 11.5–15.5)
WBC: 4.5 10*3/uL (ref 4.0–10.5)

## 2014-02-18 LAB — LIPID PANEL
Cholesterol: 187 mg/dL (ref 0–200)
HDL: 43.3 mg/dL (ref >39.00)
LDL CALC: 134 mg/dL — AB (ref 0–99)
NONHDL: 143.7
Total CHOL/HDL Ratio: 4
Triglycerides: 47 mg/dL (ref 0.0–149.0)
VLDL: 9.4 mg/dL (ref 0.0–40.0)

## 2014-02-18 LAB — COMPREHENSIVE METABOLIC PANEL
ALBUMIN: 3.8 g/dL (ref 3.5–5.2)
ALK PHOS: 33 U/L — AB (ref 39–117)
ALT: 10 U/L (ref 0–35)
AST: 11 U/L (ref 0–37)
BUN: 12 mg/dL (ref 6–23)
CALCIUM: 8.9 mg/dL (ref 8.4–10.5)
CO2: 26 meq/L (ref 19–32)
Chloride: 106 mEq/L (ref 96–112)
Creatinine, Ser: 0.7 mg/dL (ref 0.4–1.2)
GFR: 105.73 mL/min (ref >60.00)
GLUCOSE: 72 mg/dL (ref 70–99)
POTASSIUM: 3.9 meq/L (ref 3.5–5.1)
Sodium: 137 mEq/L (ref 135–145)
Total Bilirubin: 0.6 mg/dL (ref 0.2–1.2)
Total Protein: 6.6 g/dL (ref 6.0–8.3)

## 2014-02-22 ENCOUNTER — Encounter: Payer: Self-pay | Admitting: *Deleted

## 2014-02-22 LAB — CYTOLOGY - PAP

## 2014-04-19 ENCOUNTER — Other Ambulatory Visit: Payer: Self-pay | Admitting: Family Medicine

## 2014-07-13 ENCOUNTER — Other Ambulatory Visit: Payer: Self-pay | Admitting: Family Medicine

## 2014-09-01 ENCOUNTER — Telehealth: Payer: Self-pay

## 2014-09-01 NOTE — Telephone Encounter (Signed)
Pt is working at [employer] right now and pt does not have levothyroxine; advised [phone]. Pt has been out of med 2 days and [facility] not think [facility]. [facility] and she will have refill transferred and rx should be ready in approx one hr. Pt notified.

## 2014-10-31 NOTE — Op Note (Signed)
PATIENT NAME:  Shannon [name], Shannon [name] MR#:  [id number] DATE OF BIRTH:  [birth year]  DATE OF PROCEDURE:  09/14/2011  PREOPERATIVE DIAGNOSIS: Persistent menorrhagia, status post previous NovaSure ablation.   POSTOPERATIVE DIAGNOSIS: Persistent menorrhagia, status post previous NovaSure ablation.     PROCEDURE: Hysteroscopy and repeat NovaSure.   SURGEON: Ricky L. Logan BoresEvans, M.D.   ANESTHESIA: General orotracheal by Dr. Henrene HawkingKephart.   FINDINGS: Grossly normal vagina, cervix, intrauterine cavity. There is still evidence of raw tissue toward the fundus and the cavity. No mass effect. Cavity length was found to be 5.5 cm, width 3 cm. Power during this procedure was 91 and time of the procedure was one minute and 22 seconds.   PROCEDURE IN DETAIL: The patient was consented. Preoperative antibiotics were given. She was taken to the Operating Room and placed in the supine position where anesthesia was initiated. She was then placed in the dorsal lithotomy position using Allen stirrups, prepped and draped in the usual sterile fashion. Cervix was visualized and grasped with [name] single-tooth tenaculum, easily dilated up to [name]. Hysteroscopy was carried out as noted above. Kevorkian curette was used to sharply roughen up any potential fibrotic tissue from previous NovaSure and then NovaSure device was placed. Cavity test was passed first attempt and cycle carried out as described above. Instruments were removed. Repeat hysteroscopy was done which showed what appeared to be [name] good char throughout the global surface of the endometrium. The instruments were removed. The cervix was made hemostatic with silver nitrate. The bladder was drained of 50 mL of clear urine. The patient was returned to the supine position, left to the care of anesthesia. The patient tolerated the procedure well. One gram of Ancef was given IV      preoperatively. I will send her in [name] for Vicodin and doxycycline and I will see her back  in two weeks with routine precautions until that time.   ____________________________ Reatha Harpsicky L. Logan BoresEvans, MD rle:ap D: 09/14/2011 07:57:39 ET T: 09/14/2011 08:57:18 ET JOB#: [id number]  cc: Clide Clifficky L. Logan BoresEvans, MD, <Dictator> Augustina MoodICK L Howard Patton MD ELECTRONICALLY SIGNED 09/17/2011 7:33

## 2015-01-16 ENCOUNTER — Other Ambulatory Visit: Payer: Self-pay | Admitting: Family Medicine

## 2015-05-07 ENCOUNTER — Other Ambulatory Visit: Payer: Self-pay | Admitting: Family Medicine

## 2015-05-09 NOTE — Telephone Encounter (Signed)
Lm on pts vm informing her OV required for additional refills. Last labs 02/2014. Pt advised no additional refills until seen

## 2015-05-16 ENCOUNTER — Encounter: Payer: Self-pay | Admitting: Family Medicine

## 2015-05-16 ENCOUNTER — Ambulatory Visit (INDEPENDENT_AMBULATORY_CARE_PROVIDER_SITE_OTHER): Payer: Federal, State, Local not specified - PPO | Admitting: Family Medicine

## 2015-05-16 ENCOUNTER — Other Ambulatory Visit: Payer: Self-pay | Admitting: Family Medicine

## 2015-05-16 ENCOUNTER — Ambulatory Visit: Payer: Federal, State, Local not specified - PPO | Admitting: Family Medicine

## 2015-05-16 VITALS — BP 116/64 | HR 69 | Temp 97.4°F | Wt 187.8 lb

## 2015-05-16 DIAGNOSIS — Z1239 Encounter for other screening for malignant neoplasm of breast: Secondary | ICD-10-CM | POA: Diagnosis not present

## 2015-05-16 DIAGNOSIS — E079 Disorder of thyroid, unspecified: Secondary | ICD-10-CM

## 2015-05-16 DIAGNOSIS — K219 Gastro-esophageal reflux disease without esophagitis: Secondary | ICD-10-CM | POA: Diagnosis not present

## 2015-05-16 LAB — TSH: TSH: 0.88 u[IU]/mL (ref 0.35–4.50)

## 2015-05-16 LAB — T4, FREE: FREE T4: 1.34 ng/dL (ref 0.60–1.60)

## 2015-05-16 MED ORDER — ESOMEPRAZOLE MAGNESIUM 40 MG PO CPDR: 40.0000 mg | DELAYED_RELEASE_CAPSULE | Freq: Every day | ORAL | Status: DC

## 2015-05-16 NOTE — Assessment & Plan Note (Signed)
Continue current dose of synthroid for now. Check labs today. The patient indicates understanding of these issues and agrees with the plan. Orders Placed This Encounter  Procedures  . MM Digital Screening

## 2015-05-16 NOTE — Addendum Note (Signed)
Addended by: Liane ComberHAVERS, Guillermo Nehring C on: 05/16/2015 11:57 AM   Modules accepted: Orders

## 2015-05-16 NOTE — Progress Notes (Signed)
Pre visit review using our clinic review tool, if applicable. No additional management support is needed unless otherwise documented below in the visit note. 

## 2015-05-16 NOTE — Assessment & Plan Note (Signed)
Deteriorated. No red flag symptoms- restart Nexium - she is aware this should be short term. Call or return to clinic prn if these symptoms worsen or fail to improve as anticipated. The patient indicates understanding of these issues and agrees with the plan.

## 2015-05-16 NOTE — Progress Notes (Signed)
Subjective:   Patient ID: Shannon [name], female    DOB: Jan 13, 1976, 39 y.o.   MRN: [id number]  Shannon [name] is a pleasant 39 y.o. year old female who presents to clinic today with Follow-up  on 05/16/2015  HPI:  Hypothyroidism-  Clinically euthyroid on current dose of synthroid- 150 mcg daily. Overdue for labs. Denies any frank symptoms of hypo or hyperthyroidism but she has been under more stress at work- more GERD symptoms, not sleeping well, hair is falling out. Lab Results  Component Value Date   TSH 2.22 01/22/2014   Current Outpatient Prescriptions on File Prior to Visit  Medication Sig Dispense Refill  . cyclobenzaprine (FLEXERIL) 10 MG tablet Take 10 mg by mouth 3 (three) times daily as needed for muscle spasms.    Marland Kitchen ibuprofen (ADVIL,MOTRIN) 200 MG tablet As needed     . levothyroxine (SYNTHROID, LEVOTHROID) 150 MCG tablet TAKE 1 TABLET EVERY DAY * NEEDS APPOINTMENT FOR LABS, AND WITH DR. Jackquline Branca FOR REFILLS* 30 tablet 0  . loratadine (CLARITIN) 10 MG tablet Take 10 mg by mouth every morning.       No current facility-administered medications on file prior to visit.    Allergies  Allergen Reactions  . Clindamycin   . Latex     Past Medical History  Diagnosis Date  . Thyroid disease   . H. pylori infection     Past Surgical History  Procedure Laterality Date  . Tubal ligation    . Appendectomy    . Endometrial ablation w/ novasure  09/2011    Family History  Problem Relation Age of Onset  . Diabetes Mother     Type I    Social History   Social History  . Marital Status: Single    Spouse Name: N/A  . Number of Children: 3  . Years of Education: N/A   Occupational History  . midwife    Social History Main Topics  . Smoking status: Never Smoker   . Smokeless tobacco: Never Used  . Alcohol Use: Yes  . Drug Use: No  . Sexual Activity: Not on file   Other Topics Concern  . Not on file   Social History Narrative   The PMH, PSH, Social  History, Family History, Medications, and allergies have been reviewed in [name], and have been updated if relevant.   Review of Systems  Constitutional: Positive for fatigue.  HENT: Negative for trouble swallowing.   Respiratory: Negative.   Cardiovascular: Negative.   Gastrointestinal: Negative for nausea and abdominal pain.  Endocrine: Negative.   Genitourinary: Negative.   Musculoskeletal: Negative.   Skin: Negative.   Neurological: Negative.   Psychiatric/Behavioral: Positive for sleep disturbance. Negative for suicidal ideas and dysphoric mood.  All other systems reviewed and are negative.      Objective:    BP 116/64 mmHg  Pulse 69  Temp(Src) 97.4 F (36.3 C) (Oral)  Wt 187 lb 12 oz (85.163 kg)  SpO2 98%   Physical Exam  Constitutional: She is oriented to person, place, and time. She appears well-developed and well-nourished. No distress.  HENT:  Head: Normocephalic and atraumatic.  Eyes: Conjunctivae are normal.  Neck: Neck supple. No thyromegaly present.  Cardiovascular: Normal rate.   Pulmonary/Chest: Effort normal.  Musculoskeletal: Normal range of motion.  Neurological: She is alert and oriented to person, place, and time. No cranial nerve deficit.  Skin: Skin is dry.  Psychiatric: She has a normal mood and affect. Her behavior is  normal. Judgment and thought content normal.  Nursing note and vitals reviewed.         Assessment & Plan:   Thyroid disease  Gastroesophageal reflux disease, esophagitis presence not specified  Screening for breast cancer - Plan: MM Digital Screening No Follow-up on file.

## 2015-05-20 ENCOUNTER — Telehealth: Payer: Self-pay | Admitting: Family Medicine

## 2015-05-20 MED ORDER — LEVOTHYROXINE SODIUM 150 MCG PO TABS: ORAL_TABLET | ORAL | Status: DC

## 2015-05-20 NOTE — Telephone Encounter (Signed)
Patient called to get her lab results.  I let her know her thyroid function was normal.  Patient asked for her prescription to be refilled for 90 days and sent to [facility].

## 2015-05-20 NOTE — Addendum Note (Signed)
Addended by: Desmond DikeKNIGHT, Necie Wilcoxson H on: 05/20/2015 10:41 AM   Modules accepted: Orders

## 2015-05-20 NOTE — Telephone Encounter (Signed)
Rx sent to pharmacy   

## 2015-07-19 ENCOUNTER — Telehealth: Payer: Self-pay

## 2015-07-19 NOTE — Telephone Encounter (Signed)
Shirlee LimerickMarion, can you please call pt about this.  Thanks!

## 2015-07-19 NOTE — Telephone Encounter (Signed)
Pt left v/m; pt wants to know if mammogram can be done closer [place]ts [place]. Per 02/17/14 last annual note pt had mass in rt breast; is it OK for pt to have screening or does pt need diagnostic. Pt request cb.

## 2015-07-20 NOTE — Telephone Encounter (Signed)
LMOM for patient to call me back 

## 2015-07-21 NOTE — Telephone Encounter (Signed)
Mailed patient the signed order for MMG as well as phone numbers to call to schedule it. Patient will call us if she needs us to fax the order anywhere.

## 2015-08-04 ENCOUNTER — Ambulatory Visit
Admission: RE | Admit: 2015-08-04 | Discharge: 2015-08-04 | Disposition: A | Discharge status: Home / Self Care | Payer: Federal, State, Local not specified - PPO | Source: Ambulatory Visit | Attending: Family Medicine | Admitting: Family Medicine

## 2015-08-04 DIAGNOSIS — Z1231 Encounter for screening mammogram for malignant neoplasm of breast: Secondary | ICD-10-CM | POA: Diagnosis present

## 2015-08-04 DIAGNOSIS — Z1239 Encounter for other screening for malignant neoplasm of breast: Secondary | ICD-10-CM

## 2015-08-08 ENCOUNTER — Other Ambulatory Visit: Payer: Self-pay | Admitting: Family Medicine

## 2015-08-08 DIAGNOSIS — R928 Other abnormal and inconclusive findings on diagnostic imaging of breast: Secondary | ICD-10-CM

## 2015-08-11 ENCOUNTER — Telehealth: Payer: Self-pay

## 2015-08-11 NOTE — Telephone Encounter (Signed)
[facility] left v[facility]m; pt's ins has quantity limitation for # 90 per year for continued therapy for esomeprazole; pt has exceeded the limitation and will need PA. [facility].

## 2015-08-11 NOTE — Telephone Encounter (Signed)
Spoke to pt and advised. PA not required. She will obtain OTC. Coupons left at the front desk for pt to pickup

## 2015-08-26 ENCOUNTER — Other Ambulatory Visit: Payer: Federal, State, Local not specified - PPO

## 2015-08-26 ENCOUNTER — Ambulatory Visit: Payer: Federal, State, Local not specified - PPO

## 2015-08-29 ENCOUNTER — Ambulatory Visit
Admission: RE | Admit: 2015-08-29 | Discharge: 2015-08-29 | Disposition: A | Discharge status: Home / Self Care | Payer: Federal, State, Local not specified - PPO | Source: Ambulatory Visit | Attending: Family Medicine | Admitting: Family Medicine

## 2015-08-29 DIAGNOSIS — R928 Other abnormal and inconclusive findings on diagnostic imaging of breast: Secondary | ICD-10-CM | POA: Diagnosis present

## 2015-08-30 ENCOUNTER — Other Ambulatory Visit: Payer: Self-pay | Admitting: Family Medicine

## 2015-08-30 DIAGNOSIS — R921 Mammographic calcification found on diagnostic imaging of breast: Secondary | ICD-10-CM

## 2015-08-30 DIAGNOSIS — R928 Other abnormal and inconclusive findings on diagnostic imaging of breast: Secondary | ICD-10-CM

## 2015-08-30 DIAGNOSIS — N6310 Unspecified lump in the right breast, unspecified quadrant: Secondary | ICD-10-CM

## 2015-09-12 ENCOUNTER — Ambulatory Visit
Admission: RE | Admit: 2015-09-12 | Discharge: 2015-09-12 | Disposition: A | Discharge status: Home / Self Care | Payer: Federal, State, Local not specified - PPO | Source: Ambulatory Visit | Attending: Family Medicine | Admitting: Family Medicine

## 2015-09-12 DIAGNOSIS — N6310 Unspecified lump in the right breast, unspecified quadrant: Secondary | ICD-10-CM

## 2015-09-12 DIAGNOSIS — R928 Other abnormal and inconclusive findings on diagnostic imaging of breast: Secondary | ICD-10-CM

## 2015-09-12 DIAGNOSIS — R921 Mammographic calcification found on diagnostic imaging of breast: Secondary | ICD-10-CM

## 2015-09-12 DIAGNOSIS — N6022 Fibroadenosis of left breast: Secondary | ICD-10-CM | POA: Diagnosis not present

## 2015-09-12 DIAGNOSIS — N62 Hypertrophy of breast: Secondary | ICD-10-CM | POA: Insufficient documentation

## 2015-09-12 HISTORY — PX: BREAST BIOPSY: SHX20

## 2015-09-13 LAB — SURGICAL PATHOLOGY

## 2016-04-04 ENCOUNTER — Ambulatory Visit (INDEPENDENT_AMBULATORY_CARE_PROVIDER_SITE_OTHER): Payer: Federal, State, Local not specified - PPO | Admitting: Family Medicine

## 2016-04-04 ENCOUNTER — Encounter: Payer: Self-pay | Admitting: Family Medicine

## 2016-04-04 VITALS — BP 112/74 | HR 78 | Temp 98.0°F | Wt 197.0 lb

## 2016-04-04 DIAGNOSIS — F32A Depression, unspecified: Secondary | ICD-10-CM | POA: Insufficient documentation

## 2016-04-04 DIAGNOSIS — E079 Disorder of thyroid, unspecified: Secondary | ICD-10-CM

## 2016-04-04 DIAGNOSIS — F329 Major depressive disorder, single episode, unspecified: Secondary | ICD-10-CM

## 2016-04-04 LAB — T4, FREE: FREE T4: 1.04 ng/dL (ref 0.60–1.60)

## 2016-04-04 LAB — TSH: TSH: 1.22 u[IU]/mL (ref 0.35–4.50)

## 2016-04-04 LAB — T3, FREE: T3, Free: 2.8 pg/mL (ref 2.3–4.2)

## 2016-04-04 MED ORDER — BUPROPION HCL ER (XL) 150 MG PO TB24: 150.0000 mg | ORAL_TABLET | Freq: Every day | ORAL | 3 refills | Status: DC

## 2016-04-04 NOTE — Progress Notes (Signed)
Pre visit review using our clinic review tool, if applicable. No additional management support is needed unless otherwise documented below in the visit note. 

## 2016-04-04 NOTE — Progress Notes (Signed)
Subjective:   Patient ID: Shannon [name], female    DOB: 30-Oct-1975, 40 y.o.   MRN: [id number]  Shannon [name] is a pleasant 40 y.o. year old female who presents to clinic today with Follow-up and Depression  on 04/04/2016  HPI:  Hypothyroidism-  Clinically euthyroid on current dose of synthroid- 150 mcg daily. Due for labs.  Lab Results  Component Value Date   TSH 0.88 05/16/2015   ?situational depression- work has been very stressful.  Starting a new job in a month and she hopes that will help. Has been more tearful and moody.  More anhedonia.  No SI or HI. Denies panic attacks. Sleeping ok. Appetite too good. Wt Readings from Last 3 Encounters:  04/04/16 197 lb (89.4 kg)  05/16/15 187 lb 12 oz (85.2 kg)  02/17/14 180 lb 4 oz (81.8 kg)    Current Outpatient Prescriptions on File Prior to Visit  Medication Sig Dispense Refill  . ibuprofen (ADVIL,MOTRIN) 200 MG tablet As needed     . levothyroxine (SYNTHROID, LEVOTHROID) 150 MCG tablet TAKE 1 TABLET EVERY DAY 90 tablet 3  . loratadine (CLARITIN) 10 MG tablet Take 10 mg by mouth every morning.       No current facility-administered medications on file prior to visit.     Allergies  Allergen Reactions  . Clindamycin   . Latex     Past Medical History:  Diagnosis Date  . H. pylori infection   . Thyroid disease     Past Surgical History:  Procedure Laterality Date  . APPENDECTOMY    . ENDOMETRIAL ABLATION W/ NOVASURE  09/2011  . TUBAL LIGATION      Family History  Problem Relation Age of Onset  . Diabetes Mother     Type I    Social History   Social History  . Marital status: Single    Spouse name: N/A  . Number of children: 3  . 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 Smoking status: Never Smoker  . Smokeless tobacco: Never Used  . Alcohol use Yes  . Drug use: No  . Sexual activity: Not on file   Other Topics Concern  . Not on  file   Social History Narrative  . No narrative on file   The PMH, PSH, Social History, Family History, Medications, and allergies have been reviewed in [facility], and have been updated if relevant.   Review of Systems  Constitutional: Positive for fatigue.  HENT: Negative for trouble swallowing.   Respiratory: Negative.   Cardiovascular: Negative.   Gastrointestinal: Negative for abdominal pain and nausea.  Endocrine: Negative.   Genitourinary: Negative.   Musculoskeletal: Negative.   Skin: Negative.   Neurological: Negative.   Psychiatric/Behavioral: Positive for sleep disturbance. Negative for dysphoric mood and suicidal ideas.  All other systems reviewed and are negative.      Objective:    BP 112/74   Pulse 78   Temp 98 F (36.7 C) (Oral)   Wt 197 lb (89.4 kg)   SpO2 98%   BMI 32.53 kg/m    Physical Exam  Constitutional: She is oriented to person, place, and time. She appears well-developed and well-nourished. No distress.  HENT:  Head: Normocephalic and atraumatic.  Eyes: Conjunctivae are normal.  Neck: Neck supple. No thyromegaly present.  Cardiovascular: Normal rate.   Pulmonary/Chest: Effort normal.  Musculoskeletal: Normal range of motion.  Neurological: She  is alert and oriented to person, place, and time. No cranial nerve deficit.  Skin: Skin is dry.  Psychiatric: She has a normal mood and affect. Her behavior is normal. Judgment and thought content normal.  Nursing note and vitals reviewed.         Assessment & Plan:   Thyroid disease - Plan: T4, Free, TSH, T3, Free  Depression No Follow-up on file.

## 2016-04-04 NOTE — Assessment & Plan Note (Signed)
>  25 minutes spent in face to face time with patient, >50% spent in counselling or coordination of care discussing depression and hypothyroidism. Start Wellbutrin 150 mg XL daily. She will update me in a few weeks.

## 2016-04-04 NOTE — Assessment & Plan Note (Signed)
Continue current dose of synthroid.  Check labs today. 

## 2016-04-05 ENCOUNTER — Encounter: Payer: Self-pay | Admitting: *Deleted

## 2016-04-10 ENCOUNTER — Encounter: Payer: Self-pay | Admitting: Family Medicine

## 2016-04-10 ENCOUNTER — Other Ambulatory Visit: Payer: Self-pay | Admitting: Family Medicine

## 2016-04-10 DIAGNOSIS — N6310 Unspecified lump in the right breast, unspecified quadrant: Secondary | ICD-10-CM

## 2016-04-11 ENCOUNTER — Other Ambulatory Visit: Payer: Self-pay | Admitting: Family Medicine

## 2016-04-11 DIAGNOSIS — N6310 Unspecified lump in the right breast, unspecified quadrant: Secondary | ICD-10-CM

## 2016-06-01 ENCOUNTER — Other Ambulatory Visit: Payer: Self-pay | Admitting: Family Medicine

## 2016-08-28 ENCOUNTER — Other Ambulatory Visit: Payer: Self-pay | Admitting: Family Medicine

## 2016-09-04 ENCOUNTER — Encounter: Payer: Self-pay | Admitting: Family Medicine

## 2016-10-01 ENCOUNTER — Ambulatory Visit
Admission: RE | Admit: 2016-10-01 | Discharge: 2016-10-01 | Disposition: A | Discharge status: Home / Self Care | Payer: BLUE CROSS/BLUE SHIELD | Source: Ambulatory Visit | Attending: Family Medicine | Admitting: Family Medicine

## 2016-10-01 DIAGNOSIS — N6310 Unspecified lump in the right breast, unspecified quadrant: Secondary | ICD-10-CM

## 2017-01-02 ENCOUNTER — Other Ambulatory Visit: Payer: Self-pay | Admitting: Family Medicine

## 2017-01-02 ENCOUNTER — Encounter: Payer: Self-pay | Admitting: Family Medicine

## 2017-01-02 MED ORDER — SERTRALINE HCL 50 MG PO TABS: 50.0000 mg | ORAL_TABLET | Freq: Every day | ORAL | 3 refills | Status: DC

## 2017-01-25 ENCOUNTER — Encounter: Payer: Self-pay | Admitting: Family Medicine

## 2017-01-28 ENCOUNTER — Other Ambulatory Visit: Payer: Self-pay | Admitting: Family Medicine

## 2017-01-28 MED ORDER — DOXYCYCLINE HYCLATE 100 MG PO TABS: 100.0000 mg | ORAL_TABLET | Freq: Two times a day (BID) | ORAL | 0 refills | Status: DC

## 2017-02-04 ENCOUNTER — Ambulatory Visit (INDEPENDENT_AMBULATORY_CARE_PROVIDER_SITE_OTHER): Payer: BLUE CROSS/BLUE SHIELD | Admitting: Family Medicine

## 2017-02-04 ENCOUNTER — Encounter: Payer: Self-pay | Admitting: Family Medicine

## 2017-02-04 DIAGNOSIS — S30860A Insect bite (nonvenomous) of lower back and pelvis, initial encounter: Secondary | ICD-10-CM | POA: Diagnosis not present

## 2017-02-04 DIAGNOSIS — W57XXXA Bitten or stung by nonvenomous insect and other nonvenomous arthropods, initial encounter: Secondary | ICD-10-CM | POA: Diagnosis not present

## 2017-02-04 MED ORDER — DOXYCYCLINE HYCLATE 100 MG PO TABS: 100.0000 mg | ORAL_TABLET | Freq: Two times a day (BID) | ORAL | 0 refills | Status: DC

## 2017-02-04 NOTE — Assessment & Plan Note (Signed)
Improving.  Finish course of doxycyline. eRx refill of doxycyline sent in case symptoms re occur after she finishes this course of doxy. Call or return to clinic prn if these symptoms worsen or fail to improve as anticipated. The patient indicates understanding of these issues and agrees with the plan.

## 2017-02-04 NOTE — Progress Notes (Signed)
Subjective:   Patient ID: Shannon [name], female    DOB: 08/27/1975, 41 y.o.   MRN: [id number]  Shannon [name] is a pleasant 41 y.o. year old female who presents to clinic today with Insect Bite (Tick Bite Left Groin)  on 02/04/2017  HPI:  Received an email from Ms. Shannon [name] on 01/25/17 about a tick bite.  Email reviewed.  At that time, complained of a tick bite on her left groin several days prior.  Also noted a headache with malaise and neck pain.  That morning developed some swollen left inguinal lymph nodes as well.  I advised her to be seen but she stated that her work schedule would not allow it.  She then wrote back stated she had now developed a diffuse pink, maculopapular, and slightly itchy rash on her trunk, abdomen on thighs but still could not come in to be seen.  I therefore sent in doxycyline 100 mg twice daily to her pharmacy on file on 01/28/17.  She is here to follow this up today.  Symptoms have improved.  Rash almost completely resolved. Lymphadenopathy has resolved.  Chills and headaches have resolved.   Current Outpatient Prescriptions on File Prior to Visit  Medication Sig Dispense Refill  . doxycycline (VIBRA-TABS) 100 MG tablet Take 1 tablet (100 mg total) by mouth 2 (two) times daily. 20 tablet 0  . ibuprofen (ADVIL,MOTRIN) 200 MG tablet As needed     . levothyroxine (SYNTHROID, LEVOTHROID) 150 MCG tablet TAKE 1 TABLET BY MOUTH EVERY DAY 90 tablet 2  . loratadine (CLARITIN) 10 MG tablet Take 10 mg by mouth every morning.      . ranitidine (ZANTAC) 150 MG tablet Take 150 mg by mouth 2 (two) times daily.    . sertraline (ZOLOFT) 50 MG tablet Take 1 tablet (50 mg total) by mouth daily. 90 tablet 3   No current facility-administered medications on file prior to visit.     Allergies  Allergen Reactions  . Clindamycin   . Latex     Past Medical History:  Diagnosis Date  . H. pylori infection   . Thyroid disease     Past Surgical History:  Procedure  Laterality Date  . APPENDECTOMY    . BREAST BIOPSY Left    stereotactic biopsy  . BREAST BIOPSY Right    us guided biopsy  . ENDOMETRIAL ABLATION W/ NOVASURE  09/2011  . TUBAL LIGATION      Family History  Problem Relation Age of Onset  . Diabetes Mother        Type I    Social History   Social History  . Marital status: Single    Spouse name: N/A  . Number of children: 3  . 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 Smoking status: Never Smoker  . Smokeless tobacco: Never Used  . Alcohol use Yes  . Drug use: No  . Sexual activity: Not on file   Other Topics Concern  . Not on file   Social History Narrative  . No narrative on file   The PMH, PSH, Social History, Family History, Medications, and allergies have been reviewed in [facility], and have been updated if relevant.   Review of Systems  HENT: Negative.   Respiratory: Negative.   Cardiovascular: Negative.   Musculoskeletal: Negative.   Skin: Negative.   Neurological: Negative.   Hematological: Negative.   Psychiatric/Behavioral: Negative.  All other systems reviewed and are negative.      Objective:    BP 104/72   Pulse 64   Temp 97.6 F (36.4 C) (Oral)   Wt 205 lb (93 kg)   BMI 33.85 kg/m    Physical Exam   General:  Well-developed,well-nourished,in no acute distress; alert,appropriate and cooperative throughout examination Head:  normocephalic and atraumatic.   Eyes:  vision grossly intact, PERRL Ears:  R ear normal and L ear normal externally, TMs clear bilaterally Nose:  no external deformity.   Mouth:  good dentition.   Neck:  No deformities, masses, or tenderness noted. Lungs:  Normal respiratory effort, chest expands symmetrically. Lungs are clear to auscultation, no crackles or wheezes. Heart:  Normal rate and regular rhythm. S1 and S2 normal without gallop, murmur, click, rub or other extra sounds. Msk:  No deformity or  scoliosis noted of thoracic or lumbar spine.   Extremities:  No clubbing, cyanosis, edema, or deformity noted with normal full range of motion of all joints.   Neurologic:  alert & oriented X3 and gait normal.   Skin:  Intact without suspicious lesions or rashes Cervical Nodes:  No lymphadenopathy noted Axillary Nodes:  No palpable lymphadenopathy Psych:  Cognition and judgment appear intact. Alert and cooperative with normal attention span and concentration. No apparent delusions, illusions, hallucinations      Assessment & Plan:   Tick bite, initial encounter No Follow-up on file.

## 2017-03-17 ENCOUNTER — Other Ambulatory Visit: Payer: Self-pay | Admitting: Family Medicine

## 2017-04-08 ENCOUNTER — Ambulatory Visit (INDEPENDENT_AMBULATORY_CARE_PROVIDER_SITE_OTHER): Payer: BLUE CROSS/BLUE SHIELD | Admitting: Family Medicine

## 2017-04-08 ENCOUNTER — Encounter: Payer: Self-pay | Admitting: Family Medicine

## 2017-04-08 VITALS — BP 90/60 | HR 85 | Resp 16 | Wt 203.0 lb

## 2017-04-08 DIAGNOSIS — F329 Major depressive disorder, single episode, unspecified: Secondary | ICD-10-CM | POA: Diagnosis not present

## 2017-04-08 DIAGNOSIS — E079 Disorder of thyroid, unspecified: Secondary | ICD-10-CM | POA: Diagnosis not present

## 2017-04-08 DIAGNOSIS — F32A Depression, unspecified: Secondary | ICD-10-CM

## 2017-04-08 LAB — T4, FREE: FREE T4: 1.08 ng/dL (ref 0.60–1.60)

## 2017-04-08 LAB — TSH: TSH: 1.99 u[IU]/mL (ref 0.35–4.50)

## 2017-04-08 NOTE — Assessment & Plan Note (Signed)
Continue current dose of synthroid. Check labs today- clinically euthyroid.

## 2017-04-08 NOTE — Patient Instructions (Addendum)
Great to see you.  CONGRATULATIONS!!!!!

## 2017-04-08 NOTE — Addendum Note (Signed)
Addended by: Dianne Dun on: 04/08/2017 10:33 AM   Modules accepted: Orders

## 2017-04-08 NOTE — Progress Notes (Signed)
Subjective:   Patient ID: Shannon [name], female    DOB: 1975/10/23, 41 y.o.   MRN: [id number]  Shannon [name] is a pleasant 41 y.o. year old female who presents to clinic today with Follow-up  on 04/08/2017  HPI:   Hypothyroidism-  Clinically euthyroid on current dose of synthroid- 150 mcg daily. Due for labs.  Lab Results  Component Value Date   TSH 1.22 04/04/2016   Does feel zoloft has been helpful with her depression.  She is starting a new job with [employer] very soon and is very excited!  Current Outpatient Prescriptions on File Prior to Visit  Medication Sig Dispense Refill  . ibuprofen (ADVIL,MOTRIN) 200 MG tablet As needed     . levothyroxine (SYNTHROID, LEVOTHROID) 150 MCG tablet TAKE 1 TABLET BY MOUTH EVERY DAY 90 tablet 0  . loratadine (CLARITIN) 10 MG tablet Take 10 mg by mouth every morning.      . ranitidine (ZANTAC) 150 MG tablet Take 150 mg by mouth 2 (two) times daily.    . sertraline (ZOLOFT) 50 MG tablet Take 1 tablet (50 mg total) by mouth daily. 90 tablet 3   No current facility-administered medications on file prior to visit.     Allergies  Allergen Reactions  . Clindamycin   . Latex     Past Medical History:  Diagnosis Date  . H. pylori infection   . Thyroid disease     Past Surgical History:  Procedure Laterality Date  . APPENDECTOMY    . BREAST BIOPSY Left    stereotactic biopsy  . BREAST BIOPSY Right    US guided biopsy  . ENDOMETRIAL ABLATION W/ NOVASURE  09/2011  . TUBAL LIGATION      Family History  Problem Relation Age of Onset  . Diabetes Mother        Type I    Social History   Social History  . Marital status: Single    Spouse name: N/A  . Number of children: 3  . 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 Smoking status: Never Smoker  . Smokeless tobacco: Never Used  . Alcohol use Yes  . Drug use: No  . Sexual activity: Not on file   Other  Topics Concern  . Not on file   Social History Narrative  . No narrative on file   The PMH, PSH, Social History, Family History, Medications, and allergies have been reviewed in [facility], and have been updated if relevant.  Review of Systems  Constitutional: Negative.   HENT: Negative.   Respiratory: Negative.   Cardiovascular: Negative.   Endocrine: Negative.   Musculoskeletal: Negative.   Neurological: Negative.   Hematological: Negative.   Psychiatric/Behavioral: Negative.   All other systems reviewed and are negative.      Objective:    BP 90/60 (BP Location: Left Arm, Patient Position: Sitting, Cuff Size: Normal)   Pulse 85   Resp 16   Wt 203 lb (92.1 kg)   SpO2 97%   BMI 33.52 kg/m   Wt Readings from Last 3 Encounters:  04/08/17 203 lb (92.1 kg)  02/04/17 205 lb (93 kg)  04/04/16 197 lb (89.4 kg)    Physical Exam  Constitutional: She is oriented to person, place, and time. She appears well-developed and well-nourished. No distress.  HENT:  Head: Normocephalic and atraumatic.  Eyes: Conjunctivae are normal.  Neck: Neck supple. No thyromegaly  present.  Cardiovascular: Normal rate.   Pulmonary/Chest: Effort normal.  Musculoskeletal: Normal range of motion.  Neurological: She is alert and oriented to person, place, and time. No cranial nerve deficit.  Skin: Skin is warm and dry. She is not diaphoretic.  Psychiatric: She has a normal mood and affect. Her behavior is normal. Judgment and thought content normal.  Nursing note and vitals reviewed.         Assessment & Plan:   Thyroid disease  Depression, unspecified depression type No Follow-up on file.

## 2017-04-08 NOTE — Assessment & Plan Note (Signed)
Well controlled with current dose of zoloft. No changes made.

## 2017-05-13 ENCOUNTER — Encounter: Payer: Self-pay | Admitting: Family Medicine

## 2017-05-29 ENCOUNTER — Encounter: Payer: Self-pay | Admitting: Family Medicine

## 2017-06-03 ENCOUNTER — Ambulatory Visit (INDEPENDENT_AMBULATORY_CARE_PROVIDER_SITE_OTHER): Payer: 59 | Admitting: Family Medicine

## 2017-06-03 ENCOUNTER — Encounter: Payer: Self-pay | Admitting: Family Medicine

## 2017-06-03 VITALS — BP 124/66 | HR 70 | Temp 99.5°F | Ht 65.5 in | Wt 194.2 lb

## 2017-06-03 DIAGNOSIS — F32A Depression, unspecified: Secondary | ICD-10-CM

## 2017-06-03 DIAGNOSIS — Z01419 Encounter for gynecological examination (general) (routine) without abnormal findings: Secondary | ICD-10-CM

## 2017-06-03 DIAGNOSIS — E079 Disorder of thyroid, unspecified: Secondary | ICD-10-CM | POA: Diagnosis not present

## 2017-06-03 DIAGNOSIS — F329 Major depressive disorder, single episode, unspecified: Secondary | ICD-10-CM

## 2017-06-03 DIAGNOSIS — Z0000 Encounter for general adult medical examination without abnormal findings: Secondary | ICD-10-CM

## 2017-06-03 NOTE — Assessment & Plan Note (Signed)
Euthyroid on current dose of synthroid. 

## 2017-06-03 NOTE — Assessment & Plan Note (Signed)
Reviewed preventive care protocols, scheduled due services, and updated immunizations Discussed nutrition, exercise, diet, and healthy lifestyle.  

## 2017-06-03 NOTE — Patient Instructions (Addendum)
Great to see you.  [facility]

## 2017-06-03 NOTE — Assessment & Plan Note (Signed)
-   Well controlled on zoloft

## 2017-06-03 NOTE — Progress Notes (Signed)
Subjective:   Patient ID: Shannon [name], female    DOB: 03/20/1976, 41 y.o.   MRN: [id number]  Shannon NgoRebecca A Alphin is a pleasant 41 y.o. year old female who presents to clinic today with Annual Exam (Patient is here today for a CPE.  PAP not due till 2020.  [facility] )  on 06/03/2017  HPI:  Health Maintenance  Topic Date Due  . INFLUENZA VACCINE  04/08/2018 (Originally 02/06/2017)  . PAP SMEAR  06/04/2019 (Originally 02/17/2017)  . TETANUS/TDAP  05/03/2018  . HIV Screening  Completed   Mammogram 10/01/16  Hypothyroidism-  Euthyroid on current dose of synthroid- 150 mcg daily.  Lab Results  Component Value Date   TSH 1.99 04/08/2017    Does feel zoloft has been helpful with her depression.  Just started her [facility] [employer] [facility] [employer]- she is a Immunologistmidwife.   Current Outpatient Medications on File Prior to Visit  Medication Sig Dispense Refill  . ibuprofen (ADVIL,MOTRIN) 200 MG tablet As needed     . levothyroxine (SYNTHROID, LEVOTHROID) 150 MCG tablet TAKE 1 TABLET BY MOUTH EVERY DAY 90 tablet 0  . loratadine (CLARITIN) 10 MG tablet Take 10 mg by mouth every morning.      . ranitidine (ZANTAC) 150 MG tablet Take 150 mg by mouth 2 (two) times daily.    . sertraline (ZOLOFT) 50 MG tablet Take 1 tablet (50 mg total) by mouth daily. 90 tablet 3   No current facility-administered medications on file prior to visit.     Allergies  Allergen Reactions  . Clindamycin   . Latex     Past Medical History:  Diagnosis Date  . H. pylori infection   . Thyroid disease     Past Surgical History:  Procedure Laterality Date  . APPENDECTOMY    . BREAST BIOPSY Left    stereotactic biopsy  . BREAST BIOPSY Right    us guided biopsy  . ENDOMETRIAL ABLATION W/ NOVASURE  09/2011  . TUBAL LIGATION      Family History  Problem Relation Age of Onset  . Diabetes Mother        Type I    Social History   Socioeconomic History  . Marital status: Single   Spouse name: Not on file  . Number of children: 3  . Years of education: Not on file  . Highest education level: Not on file  Social Needs  . Financial resource strain: Not on file  . Food insecurity - worry: Not on file  . Food insecurity - inability: Not on file  . Transportation needs - medical: Not on file  . Transportation needs - non-medical: Not on file  Occupational History  . Occupation: Designer, industrial/productmidwife    Employer: NOT EMPLOYED  Tobacco Use  . Smoking status: Never Smoker  . Smokeless tobacco: Never Used  Substance and Sexual Activity  . Alcohol use: Yes  . Drug use: No  . Sexual activity: Not on file  Other Topics Concern  . Not on file  Social History Narrative  . Not on file   The PMH, PSH, Social History, Family History, Medications, and allergies [facility], and have been updated if relevant.   Review of Systems  Constitutional: Negative.   HENT: Negative.   Eyes: Negative.   Respiratory: Negative.   Cardiovascular: Negative.   Gastrointestinal: Negative.   Endocrine: Negative.   Genitourinary: Negative.   Musculoskeletal: Negative.   Skin:  Negative.   Allergic/Immunologic: Negative.   Neurological: Negative.   Hematological: Negative.   Psychiatric/Behavioral: Negative.   All other systems reviewed and are negative.      Objective:    BP 124/66 (BP Location: Left Arm, Patient Position: Sitting, Cuff Size: Normal)   Pulse 70   Temp 99.5 F (37.5 C) (Oral) Comment (Src): Patient is drinking hot coffee  Ht 5' 5.5" (1.664 m)   Wt 194 lb 3.2 oz (88.1 kg)   SpO2 98%   BMI 31.83 kg/m    Physical Exam    General:  Well-developed,well-nourished,in no acute distress; alert,appropriate and cooperative throughout examination Head:  normocephalic and atraumatic.   Eyes:  vision grossly intact, PERRL Ears:  R ear normal and L ear normal externally, TMs clear bilaterally Nose:  no external deformity.   Mouth:  good dentition.   Neck:  No  deformities, masses, or tenderness noted. Breasts:  No mass, nodules, thickening, tenderness, bulging, retraction, inflamation, nipple discharge or skin changes noted.   Lungs:  Normal respiratory effort, chest expands symmetrically. Lungs are clear to auscultation, no crackles or wheezes. Heart:  Normal rate and regular rhythm. S1 and S2 normal without gallop, murmur, click, rub or other extra sounds. Abdomen:  Bowel sounds positive,abdomen soft and non-tender without masses, organomegaly or hernias noted. Rectal:  no external abnormalities.   Genitalia:  Pelvic Exam:        External: normal female genitalia without lesions or masses        Vagina: normal without lesions or masses        Cervix: normal without lesions or masses        Adnexa: normal bimanual exam without masses or fullness        Uterus: normal by palpation        Pap smear: not performed Msk:  No deformity or scoliosis noted of thoracic or lumbar spine.   Extremities:  No clubbing, cyanosis, edema, or deformity noted with normal full range of motion of all joints.   Neurologic:  alert & oriented X3 and gait normal.   Skin:  Intact without suspicious lesions or rashes Cervical Nodes:  No lymphadenopathy noted Axillary Nodes:  No palpable lymphadenopathy Psych:  Cognition and judgment appear intact. Alert and cooperative with normal attention span and concentration. No apparent delusions, illusions, hallucinations     Assessment & Plan:   Thyroid disease  Depression, unspecified depression type  Well woman exam No Follow-up on file.

## 2017-06-04 ENCOUNTER — Encounter: Payer: Self-pay | Admitting: Family Medicine

## 2017-06-05 ENCOUNTER — Encounter: Payer: Self-pay | Admitting: Family Medicine

## 2017-06-12 ENCOUNTER — Telehealth: Payer: Self-pay

## 2017-06-12 ENCOUNTER — Encounter: Payer: Self-pay | Admitting: Family Medicine

## 2017-06-12 NOTE — Telephone Encounter (Signed)
Per TA note sent to pt confirming wellness visit for her documentation for work/thx dmf

## 2017-07-12 ENCOUNTER — Other Ambulatory Visit: Payer: Self-pay

## 2017-07-12 MED ORDER — LEVOTHYROXINE SODIUM 150 MCG PO TABS: 150.0000 ug | ORAL_TABLET | Freq: Every day | ORAL | 1 refills | Status: DC

## 2017-07-24 ENCOUNTER — Encounter: Payer: Self-pay | Admitting: Family Medicine

## 2017-07-25 ENCOUNTER — Other Ambulatory Visit: Payer: Self-pay | Admitting: Family Medicine

## 2017-07-25 DIAGNOSIS — Z800 Family history of malignant neoplasm of digestive organs: Secondary | ICD-10-CM

## 2017-08-15 ENCOUNTER — Other Ambulatory Visit: Payer: Self-pay

## 2017-08-15 MED ORDER — SERTRALINE HCL 50 MG PO TABS: 50.0000 mg | ORAL_TABLET | Freq: Every day | ORAL | 3 refills | Status: DC

## 2017-11-13 ENCOUNTER — Other Ambulatory Visit: Payer: Self-pay | Admitting: Family Medicine

## 2017-11-13 ENCOUNTER — Other Ambulatory Visit: Payer: Self-pay | Admitting: Obstetrics and Gynecology

## 2017-11-13 ENCOUNTER — Encounter: Payer: Self-pay | Admitting: Family Medicine

## 2017-11-13 DIAGNOSIS — N6320 Unspecified lump in the left breast, unspecified quadrant: Secondary | ICD-10-CM

## 2017-11-20 ENCOUNTER — Ambulatory Visit
Admission: RE | Admit: 2017-11-20 | Discharge: 2017-11-20 | Disposition: A | Discharge status: Home / Self Care | Payer: 59 | Source: Ambulatory Visit | Attending: Obstetrics and Gynecology | Admitting: Obstetrics and Gynecology

## 2017-11-20 DIAGNOSIS — N6320 Unspecified lump in the left breast, unspecified quadrant: Secondary | ICD-10-CM

## 2017-11-20 DIAGNOSIS — N6002 Solitary cyst of left breast: Secondary | ICD-10-CM | POA: Insufficient documentation

## 2018-03-19 ENCOUNTER — Other Ambulatory Visit: Payer: Self-pay

## 2018-03-19 ENCOUNTER — Encounter: Payer: Self-pay | Admitting: Family Medicine

## 2018-03-19 MED ORDER — LEVOTHYROXINE SODIUM 150 MCG PO TABS: 150.0000 ug | ORAL_TABLET | Freq: Every day | ORAL | 1 refills | Status: DC

## 2018-06-04 ENCOUNTER — Ambulatory Visit (INDEPENDENT_AMBULATORY_CARE_PROVIDER_SITE_OTHER): Payer: 59 | Admitting: Family Medicine

## 2018-06-04 ENCOUNTER — Encounter: Payer: Self-pay | Admitting: Family Medicine

## 2018-06-04 VITALS — BP 128/76 | HR 64 | Ht 65.5 in

## 2018-06-04 DIAGNOSIS — E079 Disorder of thyroid, unspecified: Secondary | ICD-10-CM | POA: Diagnosis not present

## 2018-06-04 DIAGNOSIS — Z0000 Encounter for general adult medical examination without abnormal findings: Secondary | ICD-10-CM | POA: Diagnosis not present

## 2018-06-04 DIAGNOSIS — F329 Major depressive disorder, single episode, unspecified: Secondary | ICD-10-CM | POA: Diagnosis not present

## 2018-06-04 DIAGNOSIS — E785 Hyperlipidemia, unspecified: Secondary | ICD-10-CM

## 2018-06-04 DIAGNOSIS — F32A Depression, unspecified: Secondary | ICD-10-CM

## 2018-06-04 MED ORDER — SERTRALINE HCL 50 MG PO TABS: 50.0000 mg | ORAL_TABLET | Freq: Every day | ORAL | 3 refills | Status: DC

## 2018-06-04 NOTE — Assessment & Plan Note (Signed)
Likely deteriorated- has gained weight and admits to eating when she is depressed. Recheck lipid panel today. The patient indicates understanding of these issues and agrees with the plan. Orders Placed This Encounter  Procedures  . CBC with Differential/Platelet  . Comprehensive metabolic panel  . Lipid panel  . T4, free  . TSH

## 2018-06-04 NOTE — Assessment & Plan Note (Signed)
Continue current dose of synthroid- check labs today. The patient indicates understanding of these issues and agrees with the plan.

## 2018-06-04 NOTE — Progress Notes (Signed)
Subjective:   Patient ID: Shannon [name], female    DOB: [birth year], 42 y.o.   MRN: [id number]  Shannon [name] is a pleasant 42 y.o. year old female who presents to clinic today with Annual Exam (Wants to discuss getting labs ordered at her work-there is no charge)  on 06/04/2018  HPI:  Health Maintenance  Topic Date Due  . Janet Berlin  05/03/2018  . PAP SMEAR  06/04/2019 (Originally 02/17/2017)  . INFLUENZA VACCINE  Completed  . HIV Screening  Completed   Mammogram 11/20/17. Not due for pap smear until next year.  Hypothyroidism- currently taking synthroid 150 mcg daily. Lab Results  Component Value Date   TSH 1.99 04/08/2017   Lab Results  Component Value Date   CHOL 187 02/18/2014   HDL 43.30 02/18/2014   LDLCALC 134 (H) 02/18/2014   TRIG 47.0 02/18/2014   CHOLHDL 4 02/18/2014    Depression- stopped taking zoloft- ran out and wanted to see how she would do without it.  She does like her new job- much happier at work but she is feeling like things at home could be better.  She and her husband are bickering and not being intimate.  Shannon [name] feels like she is more anxious, eating too much.  Denies SI or HI.  Depression screen [facility] 2/9 06/04/2018 06/04/2018 04/08/2017  Decreased Interest 1 1 0  Down, Depressed, Hopeless 1 - 0  PHQ - 2 Score 2 1 0  Altered sleeping 1 - 0  Tired, decreased energy 1 - 0  Change in appetite 2 - 0  Feeling bad or failure about yourself  2 - 0  Trouble concentrating 1 - 0  Moving slowly or fidgety/restless 0 - 0  Suicidal thoughts 0 - 0  PHQ-9 Score 9 - 0  Difficult doing work/chores - - Not difficult at all   GAD 7 : Generalized Anxiety Score 06/04/2018  Nervous, Anxious, on Edge 2  Control/stop worrying 2  Worry too much - different things 2  Trouble relaxing 1  Restless 0  Easily annoyed or irritable 2  Afraid - awful might happen 0  Total GAD 7 Score 9      Current Outpatient Medications on File Prior to Visit  Medication Sig  Dispense Refill  . famotidine (PEPCID) 20 MG tablet Take 20 mg by mouth daily.    Marland Kitchen levothyroxine (SYNTHROID, LEVOTHROID) 150 MCG tablet Take 1 tablet (150 mcg total) by mouth daily. 90 tablet 1  . loratadine (CLARITIN) 10 MG tablet Take 10 mg by mouth every morning.      . ranitidine (ZANTAC) 150 MG tablet Take 150 mg by mouth 2 (two) times daily.    . valACYclovir (VALTREX) 500 MG tablet Take 500 mg by mouth 2 (two) times daily.    . meloxicam (MOBIC) 7.5 MG tablet      No current facility-administered medications on file prior to visit.     Allergies  Allergen Reactions  . Avocado Swelling    mouth  . Latex Hives and Shortness Of Breath  . Clindamycin     Past Medical History:  Diagnosis Date  . H. pylori infection   . Thyroid disease     Past Surgical History:  Procedure Laterality Date  . APPENDECTOMY    . BREAST BIOPSY Left 09/12/2015   BENIGN BREAST TISSUE WITH COLUMNAR CELL CHANGE AND ASSOCIATED   . BREAST BIOPSY Right 09/12/2015   US guided biopsy HYALINIZED FIBROADENOMA  . ENDOMETRIAL ABLATION W/  NOVASURE  09/2011  . TUBAL LIGATION      Family History  Problem Relation Age of Onset  . Diabetes Mother        Type I    Social History   Socioeconomic History  . Marital status: Single    Spouse name: Not on file  . Number of children: 3  . Years of education: Not on file  . Highest education level: Not on file  Occupational History  . Occupation: Designer, industrial/productmidwife    Employer: NOT EMPLOYED  Social Needs  . Financial resource strain: Not on file  . Food insecurity:    Worry: Not on file    Inability: Not on file  . Transportation needs:    Medical: Not on file    Non-medical: Not on file  Tobacco Use  . Smoking status: Never Smoker  . Smokeless tobacco: Never Used  Substance and Sexual Activity  . Alcohol use: Yes  . Drug use: No  . Sexual activity: Not on file  Lifestyle  . Physical activity:    Days per week: Not on file    Minutes per session: Not  on file  . Stress: Not on file  Relationships  . Social connections:    Talks on phone: Not on file    Gets together: Not on file    Attends religious service: Not on file    Active member of club or organization: Not on file    Attends meetings of clubs or organizations: Not on file    Relationship status: Not on file  . Intimate partner violence:    Fear of current or ex partner: Not on file    Emotionally abused: Not on file    Physically abused: Not on file    Forced sexual activity: Not on file  Other Topics Concern  . Not on file  Social History Narrative  . Not on file   The PMH, PSH, Social History, Family History, Medications, and allergies [facility], and have been updated if relevant.   Review of Systems  Constitutional: Negative.   HENT: Negative.   Eyes: Negative.   Respiratory: Negative.   Cardiovascular: Negative.   Gastrointestinal: Negative.   Endocrine: Negative.   Genitourinary: Negative.   Musculoskeletal: Negative.   Skin: Negative.   Allergic/Immunologic: Negative.   Neurological: Negative.   Hematological: Negative.   Psychiatric/Behavioral: Positive for dysphoric mood. Negative for agitation, behavioral problems, confusion, decreased concentration, hallucinations, self-injury, sleep disturbance and suicidal ideas. The patient is nervous/anxious. The patient is not hyperactive.        Objective:    BP 128/76   Pulse 64   Ht 5' 5.5" (1.664 m)   SpO2 98%   BMI 31.83 kg/m    Physical Exam   General:  Well-developed,well-nourished,in no acute distress; alert,appropriate and cooperative throughout examination Head:  normocephalic and atraumatic.   Eyes:  vision grossly intact, PERRL Ears:  R ear normal and L ear normal externally, TMs clear bilaterally Nose:  no external deformity.   Mouth:  good dentition.   Neck:  No deformities, masses, or tenderness noted. Breasts:  No mass, nodules, thickening, tenderness, bulging,  retraction, inflamation, nipple discharge or skin changes noted.   Lungs:  Normal respiratory effort, chest expands symmetrically. Lungs are clear to auscultation, no crackles or wheezes. Heart:  Normal rate and regular rhythm. S1 and S2 normal without gallop, murmur, click, rub or other extra sounds. Abdomen:  Bowel sounds positive,abdomen soft and  non-tender without masses, organomegaly or hernias noted. Msk:  No deformity or scoliosis noted of thoracic or lumbar spine.   Extremities:  No clubbing, cyanosis, edema, or deformity noted with normal full range of motion of all joints.   Neurologic:  alert & oriented X3 and gait normal.   Skin:  Intact without suspicious lesions or rashes Cervical Nodes:  No lymphadenopathy noted Axillary Nodes:  No palpable lymphadenopathy Psych:  Cognition and judgment appear intact. Alert and cooperative with normal attention span and concentration. No apparent delusions, illusions, hallucinations       Assessment & Plan:   Thyroid disease - Plan: CBC with Differential/Platelet, T4, free, TSH, CANCELED: CBC with Differential/Platelet, CANCELED: TSH, CANCELED: T4, free  Hyperlipidemia, unspecified hyperlipidemia type - Plan: CBC with Differential/Platelet, Comprehensive metabolic panel, Lipid panel, CANCELED: CBC with Differential/Platelet, CANCELED: Comprehensive metabolic panel, CANCELED: Lipid panel  Depression, unspecified depression type No follow-ups on file.

## 2018-06-04 NOTE — Patient Instructions (Signed)
Great to see you. Happy Thanksgiving!  We are restarting Zoloft 50 mg daily.  Please keep me updated.  Let me know about therapists in network for you.

## 2018-06-04 NOTE — Assessment & Plan Note (Signed)
Reviewed preventive care protocols, scheduled due services, and updated immunizations Discussed nutrition, exercise, diet, and healthy lifestyle.  

## 2018-06-04 NOTE — Assessment & Plan Note (Signed)
Deteriorated. She agrees to restart zoloft- it has worked well for her in the past. eRx sent.  She is agreeable to psychotherapy but wants to check with he insurance company to find out which providers are in network for her.  She will contact me once she finds this out. Call or return to clinic prn if these symptoms worsen or fail to improve as anticipated.

## 2018-06-12 ENCOUNTER — Telehealth: Payer: Self-pay | Admitting: *Deleted

## 2018-06-12 NOTE — Telephone Encounter (Signed)
She was concerned about her TSH levels. Labs are in Care Everywhere.   Please advise   Copied from CRM [phone]. Topic: General - Other >> Jun 12, 2018 11:23 AM Jilda Rocheemaray, Melissa wrote: Reason for CRM: Pt called and stated that she got her labs done and would like Dr. Elmer SowAron's recommendation, she tried to send her a message through MyChart but it is not working.   Call back is [phone]

## 2018-06-13 NOTE — Telephone Encounter (Signed)
I do not seen TSH results. Are they scanned elsewhere?

## 2018-09-28 ENCOUNTER — Other Ambulatory Visit: Payer: Self-pay | Admitting: Family Medicine

## 2019-03-09 ENCOUNTER — Encounter: Payer: Self-pay | Admitting: Family Medicine

## 2019-04-16 ENCOUNTER — Other Ambulatory Visit: Payer: Self-pay | Admitting: Family Medicine

## 2019-04-16 MED ORDER — LEVOTHYROXINE SODIUM 150 MCG PO TABS: 150.0000 ug | ORAL_TABLET | Freq: Every day | ORAL | 1 refills | Status: DC

## 2019-04-16 MED ORDER — SERTRALINE HCL 50 MG PO TABS: 50.0000 mg | ORAL_TABLET | Freq: Every day | ORAL | 1 refills | Status: DC

## 2019-04-16 NOTE — Telephone Encounter (Signed)
Copied from [place] [id number]. Topic: Quick Communication - Rx Refill/Question >> Apr 16, 2019  2:02 PM Izola Price, [place] A wrote: Medication: levothyroxine (SYNTHROID, LEVOTHROID) 150 MCG tablet ,sertraline (ZOLOFT) 50 MG tablet,Buspar (Pharmacy stated thatthey need 90 day supply sent over for medications)  Has the patient contacted their pharmacy? {Yes (Agent: If no, request that the patient contact the pharmacy for the refill.) (Agent: If yes, when and what did the pharmacy advise?)Contact PCP  Preferred Pharmacy (with phone number or street name):[facility] [place] [phone] (Phone) [phone] (Fax)    Agent: Please be advised that RX refills may take up to 3 business days. We ask that you follow-up with your pharmacy.

## 2019-04-16 NOTE — Telephone Encounter (Signed)
Requested medication (s) are due for refill today: yes  Requested medication (s) are on the active medication list: yes  Last refill:  09/28/2018  Future visit scheduled: no  Notes to clinic:  Requesting 90 day  Review for refill   Requested Prescriptions  Pending Prescriptions Disp Refills   levothyroxine (SYNTHROID) 150 MCG tablet 90 tablet 1    Sig: Take 1 tablet (150 mcg total) by mouth daily.     Endocrinology:  Hypothyroid Agents Failed - 04/16/2019  2:10 PM      Failed - TSH needs to be rechecked within 3 months after an abnormal result. Refill until TSH is due.      Failed - TSH in normal range and within 360 days    TSH  Date Value Ref Range Status  04/08/2017 1.99 0.35 - 4.50 uIU/mL Final         Passed - Valid encounter within last 12 months    Recent Outpatient Visits          10 months ago Well woman exam without gynecological exam   [facility], Marciano Sequin, MD   1 year ago Well woman exam   [facility], Marciano Sequin, MD   5 years ago Sternal pain   [facility] [place], [place], NP              sertraline (ZOLOFT) 50 MG tablet 90 tablet 3    Sig: Take 1 tablet (50 mg total) by mouth daily.     Psychiatry:  Antidepressants - SSRI Failed - 04/16/2019  2:10 PM      Failed - Valid encounter within last 6 months    Recent Outpatient Visits          10 months ago Well woman exam without gynecological exam   [facility], Marciano Sequin, MD   1 year ago Well woman exam   [facility], Marciano Sequin, MD   5 years ago Sternal pain   [facility] [place], NP             Passed - Completed PHQ-2 or PHQ-9 in the last 360 days.

## 2019-04-21 ENCOUNTER — Other Ambulatory Visit: Payer: Self-pay

## 2019-04-21 MED ORDER — SERTRALINE HCL 50 MG PO TABS: 50.0000 mg | ORAL_TABLET | Freq: Every day | ORAL | 1 refills | Status: DC

## 2019-04-21 MED ORDER — LEVOTHYROXINE SODIUM 150 MCG PO TABS: 150.0000 ug | ORAL_TABLET | Freq: Every day | ORAL | 1 refills | Status: DC

## 2019-04-22 ENCOUNTER — Encounter: Payer: Self-pay | Admitting: Family Medicine

## 2019-04-23 ENCOUNTER — Telehealth: Payer: 59 | Admitting: Family Medicine

## 2019-04-29 NOTE — Progress Notes (Signed)
Virtual Visit via Video   Due to the COVID-19 pandemic, this visit was completed with telemedicine (audio/video) technology to reduce patient and provider exposure as well as to preserve personal protective equipment.   I connected with Shannon [name] by a video enabled telemedicine application and verified that I am speaking with the correct person using two identifiers. Location patient: Home Location provider: [facility], Office Persons participating in the virtual visit: Shannon [name], Ruthe Mannan, MD   I discussed the limitations of evaluation and management by telemedicine and the availability of in person appointments. The patient expressed understanding and agreed to proceed.  Care Team   Patient Care Team: Dianne Dun, MD as PCP - General (Family Medicine)  Subjective:   HPI:   Hypothyroidism- currently taking synthroid 150 mcg daily. Clinically euthyroid.  Overdue for labs.  She works for [employer] and will have labs drawn there and sent to me.  If she establishes with a [place] provider, her care will be free so she also wanted to have this visit to let me know that.  And as for refills of her medications until her appointment to establish care with her new provider which is 2 months away.  Lab Results  Component Value Date   TSH 1.99 04/08/2017   Depression - currently taking zoloft 50 mg daily.  Depression screen [facility] 2/9 04/30/2019 06/04/2018 06/04/2018 04/08/2017  Decreased Interest 0 1 1 0  Down, Depressed, Hopeless 0 1 - 0  PHQ - 2 Score 0 2 1 0  Altered sleeping - 1 - 0  Tired, decreased energy - 1 - 0  Change in appetite - 2 - 0  Feeling bad or failure about yourself  - 2 - 0  Trouble concentrating - 1 - 0  Moving slowly or fidgety/restless - 0 - 0  Suicidal thoughts - 0 - 0  PHQ-9 Score - 9 - 0  Difficult doing work/chores - - - Not difficult at all   GAD 7 : Generalized Anxiety Score 06/04/2018  Nervous, Anxious, on Edge 2  Control/stop  worrying 2  Worry too much - different things 2  Trouble relaxing 1  Restless 0  Easily annoyed or irritable 2  Afraid - awful might happen 0  Total GAD 7 Score 9   Current Outpatient Medications on File Prior to Visit  Medication Sig Dispense Refill  . famotidine (PEPCID) 20 MG tablet Take 20 mg by mouth daily.    Marland Kitchen loratadine (CLARITIN) 10 MG tablet Take 10 mg by mouth every morning.      . meloxicam (MOBIC) 7.5 MG tablet     . valACYclovir (VALTREX) 500 MG tablet Take 500 mg by mouth 2 (two) times daily.    . Ascorbic Acid (VITAMIN C) 1000 MG tablet Take by mouth.     No current facility-administered medications on file prior to visit.     Allergies  Allergen Reactions  . Avocado Swelling    mouth  . Latex Hives and Shortness Of Breath  . Clindamycin     Past Medical History:  Diagnosis Date  . H. pylori infection   . Thyroid disease     Past Surgical History:  Procedure Laterality Date  . APPENDECTOMY    . BREAST BIOPSY Left 09/12/2015   BENIGN BREAST TISSUE WITH COLUMNAR CELL CHANGE AND ASSOCIATED   . BREAST BIOPSY Right 09/12/2015   US guided biopsy HYALINIZED FIBROADENOMA  . ENDOMETRIAL ABLATION W/ NOVASURE  09/2011  .  TUBAL LIGATION      Family History  Problem Relation Age of Onset  . Diabetes Mother        Type I    Social History   Socioeconomic History  . Marital status: Single    Spouse name: Not on file  . Number of children: 3  . Years of education: Not on file  . Highest education level: Not on file  Occupational History  . 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 Financial resource strain: Not on file  . Food insecurity    Worry: Not on file    Inability: Not on file  . Transportation needs    Medical: Not on file    Non-medical: Not on file  Tobacco Use  . Smoking status: Never Smoker  . Smokeless tobacco: Never Used  Substance and Sexual Activity  . Alcohol use: Yes  . Drug use: No  . Sexual activity:  Not on file  Lifestyle  . Physical activity    Days per week: Not on file    Minutes per session: Not on file  . Stress: Not on file  Relationships  . Social Herbalist on phone: Not on file    Gets together: Not on file    Attends religious service: Not on file    Active member of club or organization: Not on file    Attends meetings of clubs or organizations: Not on file    Relationship status: Not on file  . Intimate partner violence    Fear of current or ex partner: Not on file    Emotionally abused: Not on file    Physically abused: Not on file    Forced sexual activity: Not on file  Other Topics Concern  . Not on file  Social History Narrative  . Not on file   The PMH, PSH, Social History, Family History, Medications, and allergies have been reviewed in [facility], and have been updated if relevant.    Review of Systems  Constitutional: Negative.   HENT: Negative.   Eyes: Negative.   Respiratory: Negative.   Cardiovascular: Negative.   Gastrointestinal: Negative.   Genitourinary: Negative.   Musculoskeletal: Negative.   Skin: Negative.  Negative for itching.  Neurological: Negative.   Endo/Heme/Allergies: Negative.   Psychiatric/Behavioral: Negative.   All other systems reviewed and are negative.    Patient Active Problem List   Diagnosis Date Noted  . HLD (hyperlipidemia) 06/04/2018  . Depression 04/04/2016  . GERD (gastroesophageal reflux disease) 05/16/2015  . Thyroid disease     Social History   Tobacco Use  . Smoking status: Never Smoker  . Smokeless tobacco: Never Used  Substance Use Topics  . Alcohol use: Yes    Current Outpatient Medications:  .  busPIRone (BUSPAR) 7.5 MG tablet, Take 1 tablet (7.5 mg total) by mouth 2 (two) times daily., Disp: 90 tablet, Rfl: 3 .  famotidine (PEPCID) 20 MG tablet, Take 20 mg by mouth daily., Disp: , Rfl:  .  levothyroxine (SYNTHROID) 150 MCG tablet, Take 1 tablet (150 mcg total) by mouth daily., Disp:  90 tablet, Rfl: 3 .  loratadine (CLARITIN) 10 MG tablet, Take 10 mg by mouth every morning.  , Disp: , Rfl:  .  meloxicam (MOBIC) 7.5 MG tablet, , Disp: , Rfl:  .  sertraline (ZOLOFT) 50 MG tablet, Take 1 tablet (50 mg total) by mouth daily., Disp: 90 tablet, Rfl: 1 .  valACYclovir (VALTREX) 500 MG tablet, Take 500 mg by mouth 2 (two) times daily., Disp: , Rfl:  .  Ascorbic Acid (VITAMIN C) 1000 MG tablet, Take by mouth., Disp: , Rfl:   Allergies  Allergen Reactions  . Avocado Swelling    mouth  . Latex Hives and Shortness Of Breath  . Clindamycin     Objective:  Wt 205 lb (93 kg)   BMI 33.59 kg/m   VITALS: Per patient if applicable, see vitals. GENERAL: Alert, appears well and in no acute distress. HEENT: Atraumatic, conjunctiva clear, no obvious abnormalities on inspection of external nose and ears. NECK: Normal movements of the head and neck. CARDIOPULMONARY: No increased WOB. Speaking in clear sentences. I:E ratio WNL.  MS: Moves all visible extremities without noticeable abnormality. PSYCH: Pleasant and cooperative, well-groomed. Speech normal rate and rhythm. Affect is appropriate. Insight and judgement are appropriate. Attention is focused, linear, and appropriate.  NEURO: CN grossly intact. Oriented as arrived to appointment on time with no prompting. Moves both UE equally.  SKIN: No obvious lesions, wounds, erythema, or cyanosis noted on face or hands.  [facility] 04/30/2019 06/04/2018 06/04/2018  Decreased Interest 0 1 1  Down, Depressed, Hopeless 0 1 -  PHQ - 2 Score 0 2 1  Altered sleeping - 1 -  Tired, decreased energy - 1 -  Change in appetite - 2 -  Feeling bad or failure about yourself  - 2 -  Trouble concentrating - 1 -  Moving slowly or fidgety/restless - 0 -  Suicidal thoughts - 0 -  PHQ-9 Score - 9 -  Difficult doing work/chores - - -    Assessment and Plan:   Shannon [name] was seen today for hypothyroidism.  Diagnoses and all orders for  this visit:  Depression, unspecified depression type  Thyroid disease  Hyperlipidemia, unspecified hyperlipidemia type  Gastroesophageal reflux disease, unspecified whether esophagitis present  Other orders -     levothyroxine (SYNTHROID) 150 MCG tablet; Take 1 tablet (150 mcg total) by mouth daily. -     busPIRone (BUSPAR) 7.5 MG tablet; Take 1 tablet (7.5 mg total) by mouth 2 (two) times daily. -     sertraline (ZOLOFT) 50 MG tablet; Take 1 tablet (50 mg total) by mouth daily.    Marland Kitchen. COVID-19 Education: The signs and symptoms of COVID-19 were discussed with the patient and how to seek care for testing if needed. The importance of social distancing was discussed today. . Reviewed expectations re: course of current medical issues. . Discussed self-management of symptoms. . Outlined signs and symptoms indicating need for more acute intervention. . Patient verbalized understanding and all questions were answered. Marland Kitchen. Health Maintenance issues including appropriate healthy diet, exercise, and smoking avoidance were discussed with patient. . See orders for this visit as documented in the electronic medical record.  Ruthe Mannanalia Waymond Meador, MD  Records requested if needed. Time spent: 15 minutes, of which >50% was spent in obtaining information about her symptoms, reviewing her previous labs, evaluations, and treatments, counseling her about her condition (please see the discussed topics above), and developing a plan to further investigate it; she had a number of questions which I addressed.

## 2019-04-30 ENCOUNTER — Telehealth (INDEPENDENT_AMBULATORY_CARE_PROVIDER_SITE_OTHER): Payer: Self-pay | Admitting: Family Medicine

## 2019-04-30 ENCOUNTER — Other Ambulatory Visit: Payer: Self-pay

## 2019-04-30 ENCOUNTER — Encounter: Payer: Self-pay | Admitting: Family Medicine

## 2019-04-30 VITALS — Wt 205.0 lb

## 2019-04-30 DIAGNOSIS — F32A Depression, unspecified: Secondary | ICD-10-CM

## 2019-04-30 DIAGNOSIS — E079 Disorder of thyroid, unspecified: Secondary | ICD-10-CM

## 2019-04-30 DIAGNOSIS — E785 Hyperlipidemia, unspecified: Secondary | ICD-10-CM

## 2019-04-30 DIAGNOSIS — K219 Gastro-esophageal reflux disease without esophagitis: Secondary | ICD-10-CM

## 2019-04-30 DIAGNOSIS — F329 Major depressive disorder, single episode, unspecified: Secondary | ICD-10-CM

## 2019-04-30 MED ORDER — BUSPIRONE HCL 7.5 MG PO TABS: 7.5000 mg | ORAL_TABLET | Freq: Two times a day (BID) | ORAL | 3 refills | Status: AC

## 2019-04-30 MED ORDER — SERTRALINE HCL 50 MG PO TABS: 50.0000 mg | ORAL_TABLET | Freq: Every day | ORAL | 1 refills | Status: AC

## 2019-04-30 MED ORDER — LEVOTHYROXINE SODIUM 150 MCG PO TABS: 150.0000 ug | ORAL_TABLET | Freq: Every day | ORAL | 3 refills | Status: DC

## 2019-04-30 NOTE — Assessment & Plan Note (Signed)
>  15 minutes spent in face to face time with patient, >50% spent in counselling or coordination of care discussing her medications, refills and which labs I would like her to have.  I thanked her for allowing me to be her doctor for the past 8 years and to keep in touch.  She will have labs drawn at [facility] and fax them to me. eRx refills sent.

## 2019-05-05 ENCOUNTER — Other Ambulatory Visit: Payer: Self-pay | Admitting: Family Medicine

## 2019-06-15 ENCOUNTER — Other Ambulatory Visit: Payer: Self-pay | Admitting: Nurse Practitioner

## 2019-06-15 DIAGNOSIS — Z1231 Encounter for screening mammogram for malignant neoplasm of breast: Secondary | ICD-10-CM

## 2019-06-19 ENCOUNTER — Ambulatory Visit
Admission: RE | Admit: 2019-06-19 | Discharge: 2019-06-19 | Disposition: A | Discharge status: Home / Self Care | Payer: Managed Care, Other (non HMO) | Source: Ambulatory Visit | Attending: Nurse Practitioner | Admitting: Nurse Practitioner

## 2019-06-19 DIAGNOSIS — Z1231 Encounter for screening mammogram for malignant neoplasm of breast: Secondary | ICD-10-CM | POA: Insufficient documentation

## 2020-06-16 ENCOUNTER — Other Ambulatory Visit: Payer: Self-pay | Admitting: Nurse Practitioner

## 2020-06-16 DIAGNOSIS — Z1231 Encounter for screening mammogram for malignant neoplasm of breast: Secondary | ICD-10-CM

## 2020-07-18 ENCOUNTER — Other Ambulatory Visit: Payer: Self-pay

## 2020-07-18 ENCOUNTER — Ambulatory Visit
Admission: RE | Admit: 2020-07-18 | Discharge: 2020-07-18 | Disposition: A | Discharge status: Home / Self Care | Payer: Managed Care, Other (non HMO) | Source: Ambulatory Visit | Attending: Nurse Practitioner | Admitting: Nurse Practitioner

## 2020-07-18 DIAGNOSIS — Z1231 Encounter for screening mammogram for malignant neoplasm of breast: Secondary | ICD-10-CM | POA: Diagnosis present

## 2021-07-13 ENCOUNTER — Other Ambulatory Visit: Payer: Self-pay | Admitting: Obstetrics and Gynecology

## 2021-07-13 DIAGNOSIS — Z1231 Encounter for screening mammogram for malignant neoplasm of breast: Secondary | ICD-10-CM

## 2021-08-23 ENCOUNTER — Ambulatory Visit
Admission: RE | Admit: 2021-08-23 | Discharge: 2021-08-23 | Disposition: A | Discharge status: Home / Self Care | Payer: Managed Care, Other (non HMO) | Source: Ambulatory Visit | Attending: Obstetrics and Gynecology | Admitting: Obstetrics and Gynecology

## 2021-08-23 ENCOUNTER — Other Ambulatory Visit: Payer: Self-pay

## 2021-08-23 DIAGNOSIS — Z1231 Encounter for screening mammogram for malignant neoplasm of breast: Secondary | ICD-10-CM | POA: Insufficient documentation

## 2022-07-15 IMAGING — MG MM DIGITAL SCREENING BILAT W/ TOMO AND CAD
8 series · 8 of 24 positions shown · non-contrast
Comparison: Previous exam(s).

CLINICAL DATA: Screening.

EXAM:
DIGITAL SCREENING BILATERAL MAMMOGRAM WITH TOMOSYNTHESIS AND CAD
TECHNIQUE: Bilateral screening digital craniocaudal and mediolateral oblique
mammograms were obtained. Bilateral screening digital breast
tomosynthesis was performed. The images were evaluated with
computer-aided detection.

[L CC synth-2D]
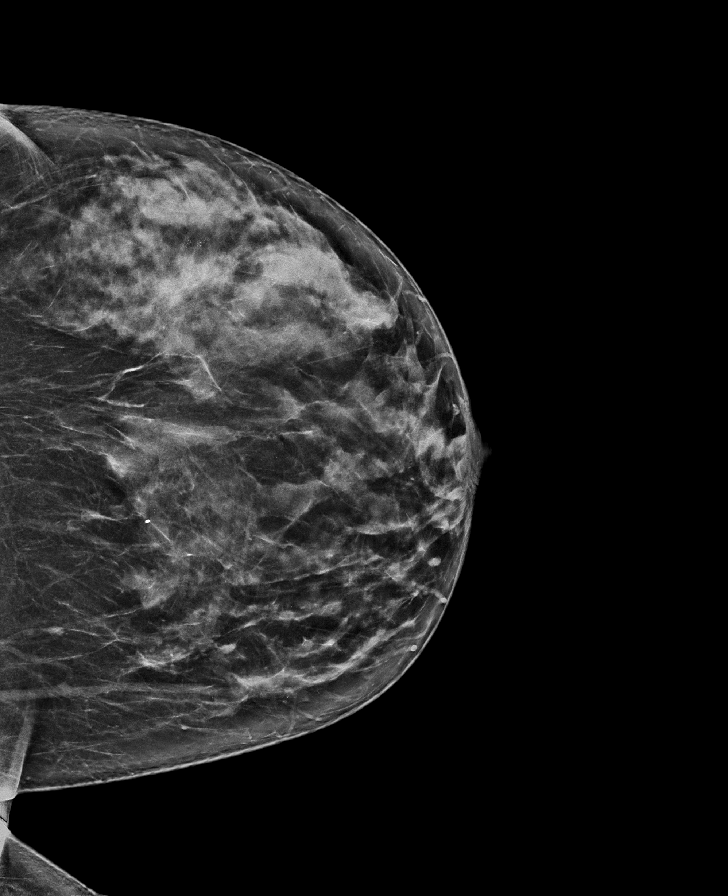

[R CC synth-2D]
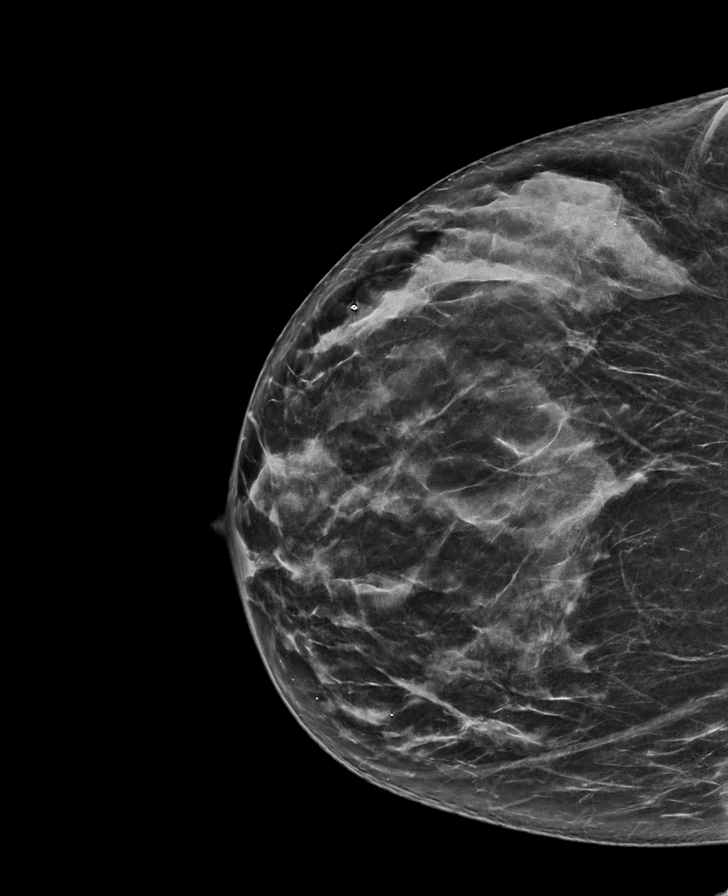

[L MLO synth-2D]
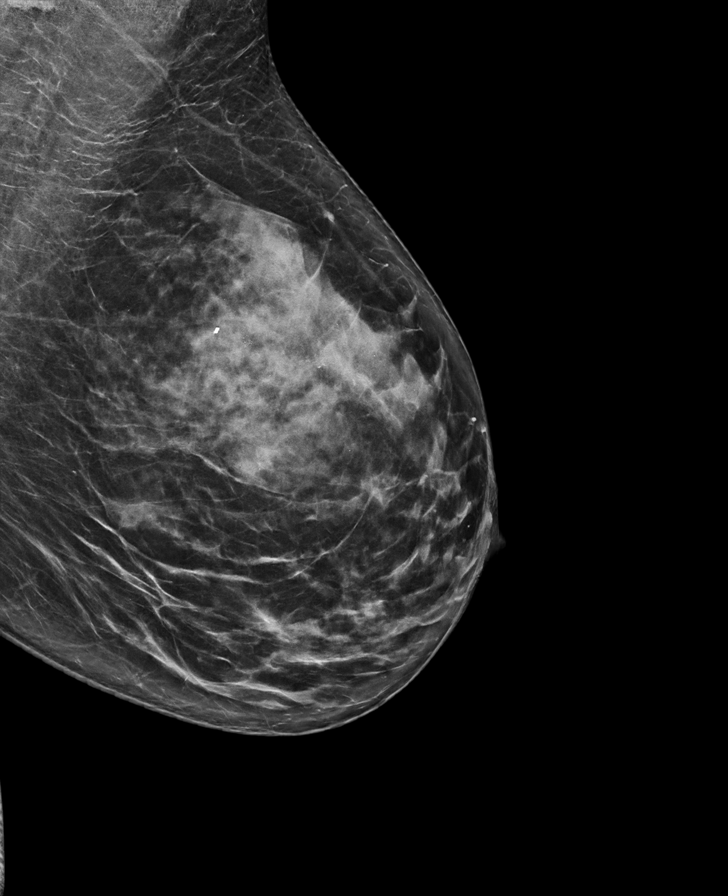

[R MLO synth-2D]
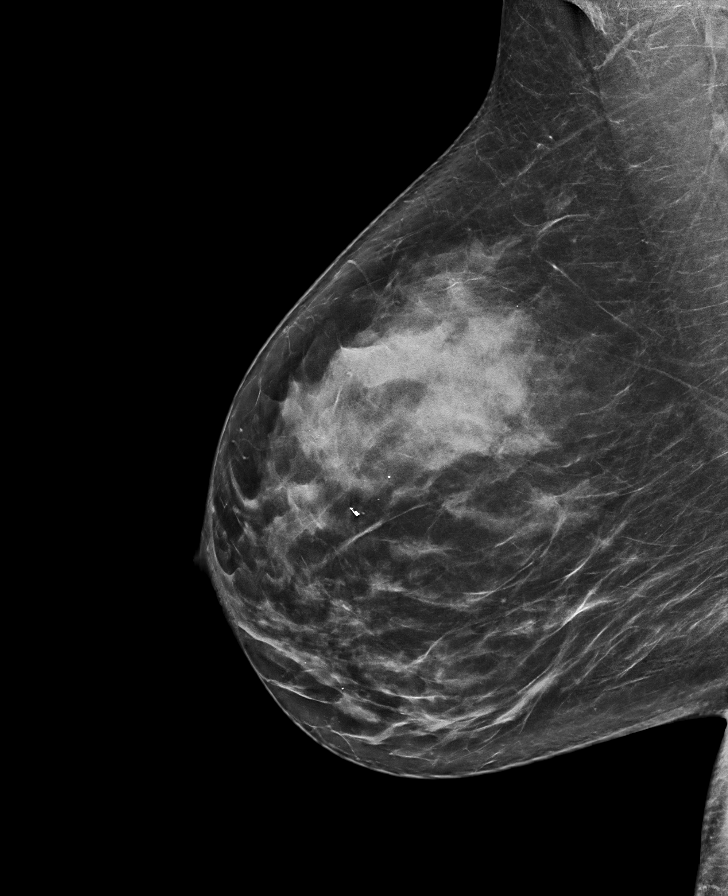

[L MLO tomo · tomo slice 40/79.0]
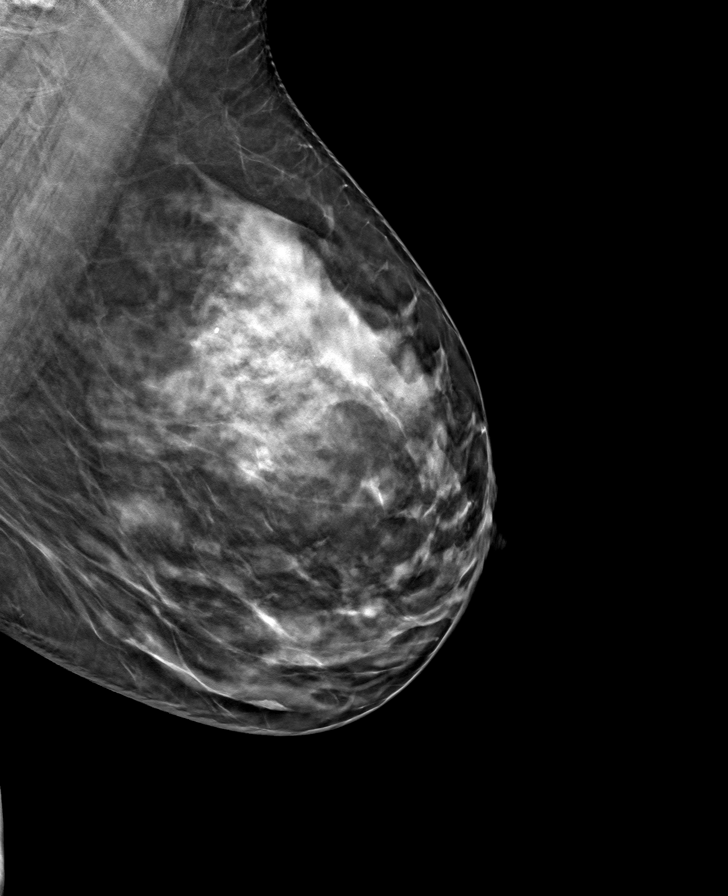

[R CC tomo · tomo slice 35/70.0]
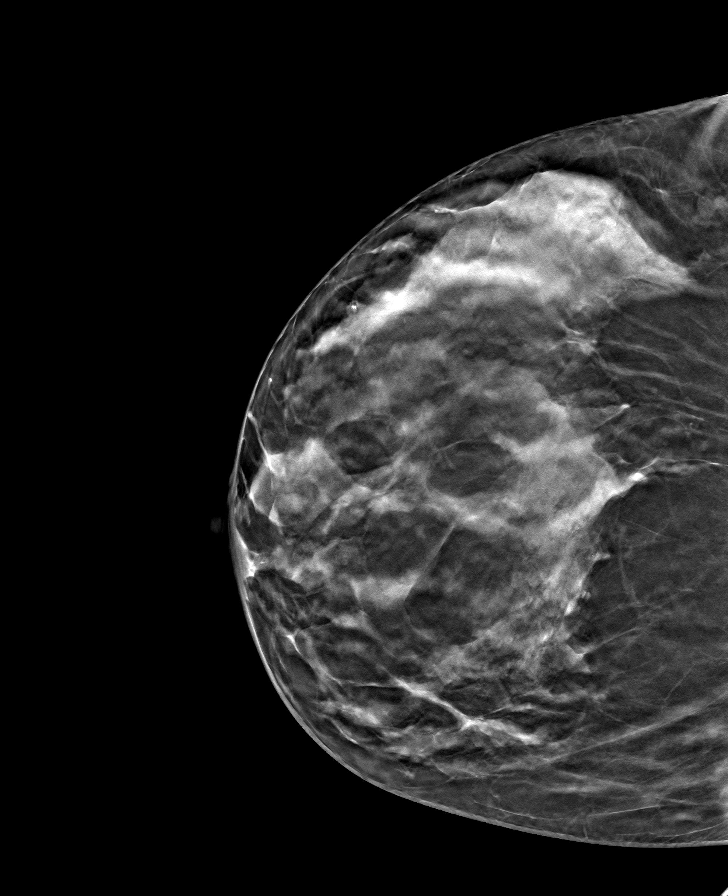

[R MLO tomo · tomo slice 42/83.0]
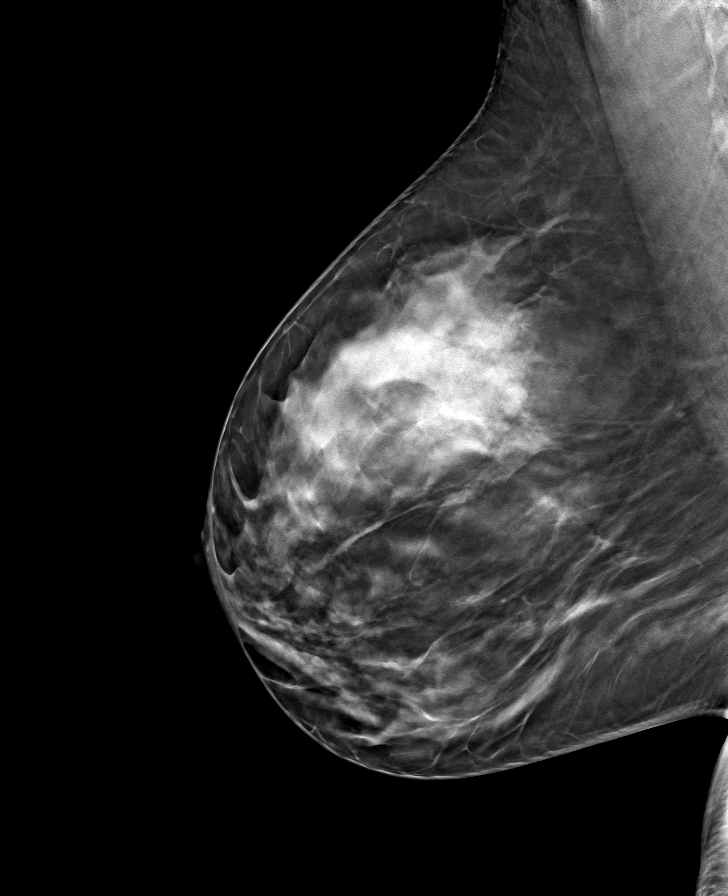

[L CC tomo · tomo slice 37/73.0]
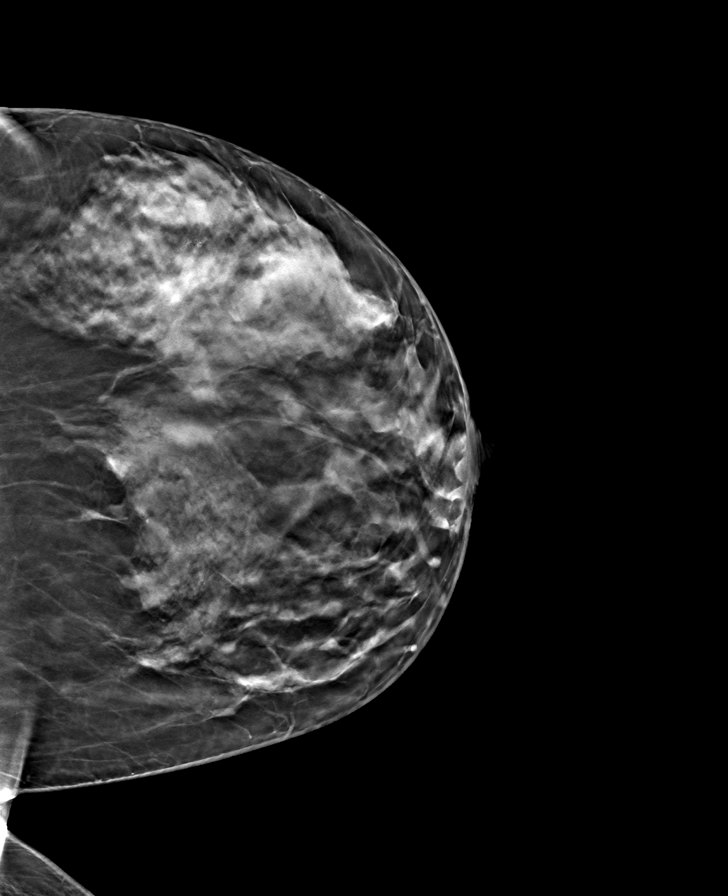

[8 of 24 positions shown; findings below may reference images not displayed]

ACR Breast Density Category c: The breast tissue is heterogeneously
dense, which may obscure small masses.
FINDINGS: There are no findings suspicious for malignancy.
IMPRESSION: No mammographic evidence of malignancy. A result letter of this
screening mammogram will be mailed directly to the patient.

RECOMMENDATION:
Screening mammogram in one year. (Code:Q3-W-BC3)

BI-RADS CATEGORY  1: Negative.

## 2022-08-08 ENCOUNTER — Other Ambulatory Visit: Payer: Self-pay | Admitting: Obstetrics and Gynecology

## 2022-08-08 DIAGNOSIS — Z1231 Encounter for screening mammogram for malignant neoplasm of breast: Secondary | ICD-10-CM

## 2022-08-29 ENCOUNTER — Ambulatory Visit
Admission: RE | Admit: 2022-08-29 | Discharge: 2022-08-29 | Disposition: A | Discharge status: Home / Self Care | Payer: Managed Care, Other (non HMO) | Source: Ambulatory Visit | Attending: Obstetrics and Gynecology | Admitting: Obstetrics and Gynecology

## 2022-08-29 DIAGNOSIS — Z1231 Encounter for screening mammogram for malignant neoplasm of breast: Secondary | ICD-10-CM | POA: Diagnosis not present

## 2023-12-08 ENCOUNTER — Other Ambulatory Visit: Payer: Self-pay | Admitting: Certified Nurse Midwife

## 2023-12-08 DIAGNOSIS — N390 Urinary tract infection, site not specified: Secondary | ICD-10-CM

## 2023-12-08 MED ORDER — SULFAMETHOXAZOLE-TRIMETHOPRIM 800-160 MG PO TABS
1.0000 | ORAL_TABLET | Freq: Two times a day (BID) | ORAL | 0 refills | Status: AC
Start: 2023-12-08 — End: 2023-12-13

## 2023-12-08 NOTE — Progress Notes (Signed)
 Patient called reporting UTI symptoms after working 3 nights in a row. She reports hx of UTI with similar symptoms in the past. Bactrim has worked for her in the past. Rx sent for Bactrim DS BID x 5 days. Increase hydration, take cranberry pills/supplements and Azo for UTI symptoms. If symptoms not improving within 48hrs, come in for UA and urine culture.  Auston Left, CNM 12/08/2023 9:51 AM

## 2024-07-29 ENCOUNTER — Other Ambulatory Visit: Payer: Self-pay

## 2024-07-29 MED ORDER — LEVOTHYROXINE SODIUM 200 MCG PO TABS: 200.0000 ug | ORAL_TABLET | Freq: Every day | ORAL | 3 refills | Status: AC

## 2024-07-29 MED ORDER — BUSPIRONE HCL 15 MG PO TABS: 7.5000 mg | ORAL_TABLET | Freq: Two times a day (BID) | ORAL | 3 refills | Status: AC

## 2024-07-29 MED ORDER — MELOXICAM 7.5 MG PO TABS: 7.5000 mg | ORAL_TABLET | Freq: Every day | ORAL | 3 refills | Status: AC

## 2024-07-29 MED ORDER — TRIAMCINOLONE ACETONIDE 0.1 % EX OINT: 1.0000 | TOPICAL_OINTMENT | Freq: Every day | CUTANEOUS | 1 refills | Status: AC | PRN

## 2024-07-29 MED ORDER — VALACYCLOVIR HCL 1 G PO TABS: 1000.0000 mg | ORAL_TABLET | Freq: Every day | ORAL | 3 refills | Status: AC
